# Patient Record
Sex: Female | Born: 1940 | Race: White | Hispanic: No | Marital: Single | Smoking: Current every day smoker
Health system: Southern US, Community
[De-identification: ages and names within clinical notes are randomized; demographics above are authoritative.]

## PROBLEM LIST (undated history)

## (undated) DIAGNOSIS — K219 Gastro-esophageal reflux disease without esophagitis: Secondary | ICD-10-CM

## (undated) DIAGNOSIS — I2510 Atherosclerotic heart disease of native coronary artery without angina pectoris: Secondary | ICD-10-CM

## (undated) DIAGNOSIS — D649 Anemia, unspecified: Secondary | ICD-10-CM

## (undated) DIAGNOSIS — I517 Cardiomegaly: Secondary | ICD-10-CM

## (undated) DIAGNOSIS — I639 Cerebral infarction, unspecified: Secondary | ICD-10-CM

## (undated) DIAGNOSIS — M542 Cervicalgia: Secondary | ICD-10-CM

## (undated) DIAGNOSIS — E119 Type 2 diabetes mellitus without complications: Secondary | ICD-10-CM

## (undated) DIAGNOSIS — R0609 Other forms of dyspnea: Secondary | ICD-10-CM

## (undated) DIAGNOSIS — M1990 Unspecified osteoarthritis, unspecified site: Secondary | ICD-10-CM

## (undated) DIAGNOSIS — J449 Chronic obstructive pulmonary disease, unspecified: Secondary | ICD-10-CM

## (undated) DIAGNOSIS — J4489 Other specified chronic obstructive pulmonary disease: Secondary | ICD-10-CM

## (undated) DIAGNOSIS — I10 Essential (primary) hypertension: Secondary | ICD-10-CM

## (undated) DIAGNOSIS — E785 Hyperlipidemia, unspecified: Secondary | ICD-10-CM

## (undated) DIAGNOSIS — I5189 Other ill-defined heart diseases: Secondary | ICD-10-CM

## (undated) DIAGNOSIS — Q619 Cystic kidney disease, unspecified: Secondary | ICD-10-CM

## (undated) DIAGNOSIS — E039 Hypothyroidism, unspecified: Secondary | ICD-10-CM

## (undated) DIAGNOSIS — K5190 Ulcerative colitis, unspecified, without complications: Secondary | ICD-10-CM

## (undated) DIAGNOSIS — R0600 Dyspnea, unspecified: Secondary | ICD-10-CM

## (undated) DIAGNOSIS — R51 Headache: Secondary | ICD-10-CM

## (undated) DIAGNOSIS — Z9289 Personal history of other medical treatment: Secondary | ICD-10-CM

## (undated) DIAGNOSIS — F419 Anxiety disorder, unspecified: Secondary | ICD-10-CM

## (undated) HISTORY — PX: ABDOMINAL HYSTERECTOMY: SHX81

## (undated) HISTORY — DX: Cerebral infarction, unspecified: I63.9

## (undated) HISTORY — DX: Hypothyroidism, unspecified: E03.9

## (undated) HISTORY — DX: Dyspnea, unspecified: R06.00

## (undated) HISTORY — PX: KNEE SURGERY: SHX244

## (undated) HISTORY — PX: BREAST CYST EXCISION: SHX579

## (undated) HISTORY — DX: Type 2 diabetes mellitus without complications: E11.9

## (undated) HISTORY — DX: Hyperlipidemia, unspecified: E78.5

## (undated) HISTORY — PX: BACK SURGERY: SHX140

## (undated) HISTORY — DX: Cardiomegaly: I51.7

## (undated) HISTORY — DX: Other forms of dyspnea: R06.09

## (undated) HISTORY — DX: Cystic kidney disease, unspecified: Q61.9

## (undated) HISTORY — DX: Other ill-defined heart diseases: I51.89

## (undated) HISTORY — PX: TONSILLECTOMY: SUR1361

## (undated) HISTORY — PX: APPENDECTOMY: SHX54

## (undated) HISTORY — DX: Essential (primary) hypertension: I10

## (undated) HISTORY — DX: Cervicalgia: M54.2

## (undated) HISTORY — DX: Chronic obstructive pulmonary disease, unspecified: J44.9

## (undated) HISTORY — PX: CHOLECYSTECTOMY: SHX55

## (undated) HISTORY — DX: Atherosclerotic heart disease of native coronary artery without angina pectoris: I25.10

## (undated) HISTORY — DX: Other specified chronic obstructive pulmonary disease: J44.89

## (undated) HISTORY — PX: OTHER SURGICAL HISTORY: SHX169

---

## 2004-09-19 ENCOUNTER — Ambulatory Visit: Payer: Self-pay | Admitting: Internal Medicine

## 2004-09-26 ENCOUNTER — Ambulatory Visit: Payer: Self-pay | Admitting: Internal Medicine

## 2005-07-11 ENCOUNTER — Ambulatory Visit: Payer: Self-pay | Admitting: Cardiovascular Disease

## 2005-07-11 HISTORY — PX: CARDIAC CATHETERIZATION: SHX172

## 2005-07-15 ENCOUNTER — Ambulatory Visit: Payer: Self-pay | Admitting: Oncology

## 2005-07-18 ENCOUNTER — Ambulatory Visit (HOSPITAL_COMMUNITY): Admission: RE | Admit: 2005-07-18 | Discharge: 2005-07-18 | Payer: Self-pay | Admitting: Oncology

## 2005-07-25 ENCOUNTER — Encounter: Admission: RE | Admit: 2005-07-25 | Discharge: 2005-07-25 | Payer: Self-pay | Admitting: Oncology

## 2005-08-12 ENCOUNTER — Ambulatory Visit (HOSPITAL_COMMUNITY): Admission: RE | Admit: 2005-08-12 | Discharge: 2005-08-12 | Payer: Self-pay | Admitting: Oncology

## 2005-09-05 ENCOUNTER — Ambulatory Visit: Payer: Self-pay | Admitting: Oncology

## 2005-11-26 ENCOUNTER — Ambulatory Visit (HOSPITAL_COMMUNITY): Admission: RE | Admit: 2005-11-26 | Discharge: 2005-11-26 | Payer: Self-pay | Admitting: Oncology

## 2005-12-02 ENCOUNTER — Ambulatory Visit: Payer: Self-pay | Admitting: Oncology

## 2006-05-22 ENCOUNTER — Ambulatory Visit: Payer: Self-pay | Admitting: Oncology

## 2006-05-27 LAB — BASIC METABOLIC PANEL
BUN: 13 mg/dL (ref 6–23)
CO2: 29 mEq/L (ref 19–32)
Calcium: 9 mg/dL (ref 8.4–10.5)
Chloride: 103 mEq/L (ref 96–112)
Creatinine, Ser: 0.71 mg/dL (ref 0.40–1.20)
Glucose, Bld: 136 mg/dL — ABNORMAL HIGH (ref 70–99)
Potassium: 3.1 mEq/L — ABNORMAL LOW (ref 3.5–5.3)
Sodium: 144 mEq/L (ref 135–145)

## 2006-06-03 LAB — BASIC METABOLIC PANEL
BUN: 15 mg/dL (ref 6–23)
CO2: 31 mEq/L (ref 19–32)
Calcium: 9.2 mg/dL (ref 8.4–10.5)
Chloride: 96 mEq/L (ref 96–112)
Creatinine, Ser: 0.81 mg/dL (ref 0.40–1.20)
Glucose, Bld: 271 mg/dL — ABNORMAL HIGH (ref 70–99)
Potassium: 3.7 mEq/L (ref 3.5–5.3)
Sodium: 139 mEq/L (ref 135–145)

## 2006-06-04 ENCOUNTER — Ambulatory Visit: Payer: Self-pay | Admitting: Cardiovascular Disease

## 2006-07-12 ENCOUNTER — Emergency Department: Payer: Self-pay | Admitting: Internal Medicine

## 2006-11-12 ENCOUNTER — Ambulatory Visit: Payer: Self-pay | Admitting: Cardiovascular Disease

## 2007-05-11 HISTORY — PX: CARDIAC CATHETERIZATION: SHX172

## 2007-08-17 ENCOUNTER — Inpatient Hospital Stay: Payer: Self-pay | Admitting: Cardiovascular Disease

## 2008-05-15 ENCOUNTER — Ambulatory Visit: Payer: Self-pay | Admitting: Internal Medicine

## 2009-01-03 ENCOUNTER — Ambulatory Visit: Payer: Self-pay | Admitting: Internal Medicine

## 2009-01-03 ENCOUNTER — Ambulatory Visit: Payer: Self-pay | Admitting: Ophthalmology

## 2009-01-09 ENCOUNTER — Ambulatory Visit: Payer: Self-pay | Admitting: Ophthalmology

## 2009-01-29 ENCOUNTER — Ambulatory Visit: Payer: Self-pay | Admitting: Pain Medicine

## 2009-02-14 ENCOUNTER — Ambulatory Visit: Payer: Self-pay | Admitting: Ophthalmology

## 2009-02-20 ENCOUNTER — Ambulatory Visit: Payer: Self-pay | Admitting: Ophthalmology

## 2009-03-10 HISTORY — PX: CARDIAC CATHETERIZATION: SHX172

## 2009-03-29 ENCOUNTER — Ambulatory Visit: Payer: Self-pay | Admitting: Cardiovascular Disease

## 2009-04-30 ENCOUNTER — Inpatient Hospital Stay (HOSPITAL_COMMUNITY): Admission: RE | Admit: 2009-04-30 | Discharge: 2009-05-01 | Payer: Self-pay | Admitting: Neurosurgery

## 2009-05-10 HISTORY — PX: CARDIAC CATHETERIZATION: SHX172

## 2010-05-31 ENCOUNTER — Ambulatory Visit: Payer: Self-pay | Admitting: Oncology

## 2010-06-04 LAB — CMP (CANCER CENTER ONLY)
ALT(SGPT): 29 U/L (ref 10–47)
AST: 23 U/L (ref 11–38)
Albumin: 3.9 g/dL (ref 3.3–5.5)
Alkaline Phosphatase: 67 U/L (ref 26–84)
BUN, Bld: 17 mg/dL (ref 7–22)
CO2: 28 mEq/L (ref 18–33)
Calcium: 9.1 mg/dL (ref 8.0–10.3)
Chloride: 101 mEq/L (ref 98–108)
Creat: 1.1 mg/dl (ref 0.6–1.2)
Glucose, Bld: 108 mg/dL (ref 73–118)
Potassium: 5.3 mEq/L — ABNORMAL HIGH (ref 3.3–4.7)
Sodium: 140 mEq/L (ref 128–145)
Total Bilirubin: 0.5 mg/dl (ref 0.20–1.60)
Total Protein: 6.5 g/dL (ref 6.4–8.1)

## 2010-06-04 LAB — CBC WITH DIFFERENTIAL (CANCER CENTER ONLY)
BASO#: 0 10*3/uL (ref 0.0–0.2)
BASO%: 0.4 % (ref 0.0–2.0)
EOS%: 2.6 % (ref 0.0–7.0)
Eosinophils Absolute: 0.2 10*3/uL (ref 0.0–0.5)
HCT: 34 % — ABNORMAL LOW (ref 34.8–46.6)
HGB: 11.8 g/dL (ref 11.6–15.9)
LYMPH#: 1.6 10*3/uL (ref 0.9–3.3)
LYMPH%: 27.7 % (ref 14.0–48.0)
MCH: 31.1 pg (ref 26.0–34.0)
MCHC: 34.8 g/dL (ref 32.0–36.0)
MCV: 89 fL (ref 81–101)
MONO#: 0.4 10*3/uL (ref 0.1–0.9)
MONO%: 6.9 % (ref 0.0–13.0)
NEUT#: 3.6 10*3/uL (ref 1.5–6.5)
NEUT%: 62.4 % (ref 39.6–80.0)
Platelets: 205 10*3/uL (ref 145–400)
RBC: 3.8 10*6/uL (ref 3.70–5.32)
RDW: 14.1 % (ref 10.5–14.6)
WBC: 5.7 10*3/uL (ref 3.9–10.0)

## 2010-06-07 ENCOUNTER — Ambulatory Visit (HOSPITAL_COMMUNITY): Admission: RE | Admit: 2010-06-07 | Discharge: 2010-06-07 | Payer: Self-pay | Admitting: Oncology

## 2010-06-10 ENCOUNTER — Ambulatory Visit (HOSPITAL_COMMUNITY): Admission: RE | Admit: 2010-06-10 | Discharge: 2010-06-10 | Payer: Self-pay | Admitting: Oncology

## 2010-07-11 ENCOUNTER — Ambulatory Visit: Payer: Self-pay | Admitting: Oncology

## 2010-07-25 LAB — CBC WITH DIFFERENTIAL (CANCER CENTER ONLY)
BASO#: 0.1 10*3/uL (ref 0.0–0.2)
BASO%: 0.6 % (ref 0.0–2.0)
EOS%: 1.3 % (ref 0.0–7.0)
Eosinophils Absolute: 0.2 10*3/uL (ref 0.0–0.5)
HCT: 37.3 % (ref 34.8–46.6)
HGB: 12.8 g/dL (ref 11.6–15.9)
LYMPH#: 1.8 10*3/uL (ref 0.9–3.3)
LYMPH%: 15.9 % (ref 14.0–48.0)
MCH: 31.3 pg (ref 26.0–34.0)
MCHC: 34.5 g/dL (ref 32.0–36.0)
MCV: 91 fL (ref 81–101)
MONO#: 0.6 10*3/uL (ref 0.1–0.9)
MONO%: 5 % (ref 0.0–13.0)
NEUT#: 8.8 10*3/uL — ABNORMAL HIGH (ref 1.5–6.5)
NEUT%: 77.2 % (ref 39.6–80.0)
Platelets: 249 10*3/uL (ref 145–400)
RBC: 4.11 10*6/uL (ref 3.70–5.32)
RDW: 13.1 % (ref 10.5–14.6)
WBC: 11.4 10*3/uL — ABNORMAL HIGH (ref 3.9–10.0)

## 2010-07-25 LAB — BASIC METABOLIC PANEL - CANCER CENTER ONLY
BUN, Bld: 27 mg/dL — ABNORMAL HIGH (ref 7–22)
CO2: 27 mEq/L (ref 18–33)
Calcium: 9 mg/dL (ref 8.0–10.3)
Chloride: 97 mEq/L — ABNORMAL LOW (ref 98–108)
Creat: 1.1 mg/dl (ref 0.6–1.2)
Glucose, Bld: 137 mg/dL — ABNORMAL HIGH (ref 73–118)
Potassium: 4.7 mEq/L (ref 3.3–4.7)
Sodium: 135 mEq/L (ref 128–145)

## 2010-12-01 ENCOUNTER — Encounter: Payer: Self-pay | Admitting: Oncology

## 2010-12-21 ENCOUNTER — Emergency Department: Payer: Self-pay | Admitting: Emergency Medicine

## 2011-02-17 LAB — CBC
HCT: 34.6 % — ABNORMAL LOW (ref 36.0–46.0)
Hemoglobin: 11.8 g/dL — ABNORMAL LOW (ref 12.0–15.0)
MCHC: 34 g/dL (ref 30.0–36.0)
MCV: 94.8 fL (ref 78.0–100.0)
Platelets: 202 10*3/uL (ref 150–400)
RBC: 3.65 MIL/uL — ABNORMAL LOW (ref 3.87–5.11)
RDW: 13.3 % (ref 11.5–15.5)
WBC: 7.1 10*3/uL (ref 4.0–10.5)

## 2011-02-17 LAB — DIFFERENTIAL
Basophils Absolute: 0 10*3/uL (ref 0.0–0.1)
Basophils Relative: 0 % (ref 0–1)
Eosinophils Absolute: 0.2 10*3/uL (ref 0.0–0.7)
Eosinophils Relative: 2 % (ref 0–5)
Lymphocytes Relative: 21 % (ref 12–46)
Lymphs Abs: 1.5 10*3/uL (ref 0.7–4.0)
Monocytes Absolute: 0.4 10*3/uL (ref 0.1–1.0)
Monocytes Relative: 6 % (ref 3–12)
Neutro Abs: 5 10*3/uL (ref 1.7–7.7)
Neutrophils Relative %: 70 % (ref 43–77)

## 2011-02-17 LAB — GLUCOSE, CAPILLARY
Glucose-Capillary: 108 mg/dL — ABNORMAL HIGH (ref 70–99)
Glucose-Capillary: 113 mg/dL — ABNORMAL HIGH (ref 70–99)
Glucose-Capillary: 137 mg/dL — ABNORMAL HIGH (ref 70–99)

## 2011-02-17 LAB — ABO/RH: ABO/RH(D): O POS

## 2011-02-17 LAB — TYPE AND SCREEN
ABO/RH(D): O POS
Antibody Screen: NEGATIVE

## 2011-03-25 NOTE — Op Note (Signed)
Rhonda Park, Rhonda Park               ACCOUNT NO.:  [id number]   MEDICAL RECORD NO.:  [id number]          PATIENT TYPE:  INP   LOCATION:  3113                         FACILITY:  [facility]   PHYSICIAN:  Kathaleen Maser. Pool, M.D.    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   PROCEDURE NAME:  Left L3-4 laminotomy and microdiskectomy.   SURGEON:  Kathaleen Maser. Pool, MD   ASSISTANT:  Donalee Citrin, MD   ANESTHESIA:  General endotracheal.   INDICATIONS:  Ms. Adderly is a 70 year old female with history of back and  left lower extremity pain, paresthesias, and weakness consistent with a  left L4 radiculopathy.  Workup demonstrates evidence of an L3-4 disk  herniation with an inferior fragment on the left side with marked  compression on the left side L4 nerve root.  The patient has been  counseled as to her options.  She decided to proceed with a left-sided  L4-5 laminotomy and microdiskectomy in hopes of improving her symptoms.   OPERATIVE NOTE:  The patient was brought to the room and placed on  operative table in supine position.  After adequate level of anesthesia  was achieved, the patient was placed prone on Wilson frame,  appropriately padded, and the patient's lumbar regions were prepped and  draped sterilely.  A #10 blade was used to make a curvilinear skin  overlying the L3-4 interspace.  This was carried down sharply in the  midline.  Subperiosteal dissection was then performed exposing the  lamina and facet joints of L3 and L4.  Deep self-retaining retractor was  placed.  Intraoperative x-rays taken and level was confirmed.  Laminotomy was then performed using high-speed drill and Kerrison  rongeurs to remove the inferior aspect of lamina of  L3, medial aspect of  L3-4 facet joint, and superior rim of the L4 lamina.  Ligament flavum  was then elevated and resected in piecemeal fashion using Kerrison  rongeurs.  Underlying thecal sac and exiting left-sided L4 nerve root  were identified.  Microscope was then brought to the field and used for  microdissection of the left-sided L4 nerve root underlying disk  herniation.  Epidural venous plexus were coagulated and cut.  Thecal sac  and L4 nerve root were gently mobilized and retracted towards midline.  Disk herniation was readily apparent.  This was then incised with 15  blade.  Wide disk space clean-out was achieved using pituitary rongeurs,  upbiting pituitary rongeurs, and Epstein curettes to remove all elements  of disk herniation including the free fragment as well as any loose or  obviously degenerative disk material within the interspace.  At this  point, a very thorough diskectomy had been achieved.  There was no  injury to thecal sac or nerve roots.  Wound was then irrigated with  antibiotic solution.  Gelfoam was placed topically for hemostasis which  was found to be  good.  Microscope and retractor system were removed.  Hemostasis was  achieved with electrocautery.  Wound was then closed in layers with  Vicryl suture.  Steri-Strips and sterile dressings were applied.  There  were no complications.  The patient tolerated the procedure well and she  returns to recovery room postoperatively.           ______________________________  Kathaleen Maser Pool, M.D.     HAP/MEDQ  D:  04/30/2009  T:  05/01/2009  Job:  [id number]

## 2012-02-02 ENCOUNTER — Emergency Department: Payer: Self-pay | Admitting: *Deleted

## 2012-02-02 LAB — COMPREHENSIVE METABOLIC PANEL
Albumin: 4.1 g/dL (ref 3.4–5.0)
Anion Gap: 11 (ref 7–16)
Chloride: 107 mmol/L (ref 98–107)
Creatinine: 1.18 mg/dL (ref 0.60–1.30)
EGFR (Non-African Amer.): 48 — ABNORMAL LOW
SGOT(AST): 24 U/L (ref 15–37)
Sodium: 144 mmol/L (ref 136–145)
Total Protein: 7.6 g/dL (ref 6.4–8.2)

## 2012-02-02 LAB — CBC
HGB: 13.5 g/dL (ref 12.0–16.0)
MCH: 30.2 pg (ref 26.0–34.0)
MCHC: 33.2 g/dL (ref 32.0–36.0)
MCV: 91 fL (ref 80–100)
Platelet: 197 10*3/uL (ref 150–440)
RDW: 13.9 % (ref 11.5–14.5)
WBC: 7.3 10*3/uL (ref 3.6–11.0)

## 2012-02-02 LAB — CK TOTAL AND CKMB (NOT AT ARMC)
CK, Total: 42 U/L (ref 21–215)
CK-MB: 0.9 ng/mL (ref 0.5–3.6)

## 2012-02-02 LAB — TROPONIN I: Troponin-I: <0.02 ng/mL

## 2012-06-25 ENCOUNTER — Emergency Department: Payer: Self-pay | Admitting: Emergency Medicine

## 2012-07-06 ENCOUNTER — Emergency Department: Payer: Self-pay

## 2013-01-08 ENCOUNTER — Emergency Department: Payer: Self-pay | Admitting: Emergency Medicine

## 2013-04-22 ENCOUNTER — Encounter: Payer: Self-pay | Admitting: *Deleted

## 2013-05-03 ENCOUNTER — Ambulatory Visit: Payer: Self-pay | Admitting: Cardiovascular Disease

## 2013-05-06 ENCOUNTER — Ambulatory Visit: Payer: Self-pay | Admitting: Cardiovascular Disease

## 2013-05-09 ENCOUNTER — Ambulatory Visit: Payer: Self-pay | Admitting: Cardiovascular Disease

## 2013-09-22 ENCOUNTER — Emergency Department: Payer: Self-pay | Admitting: Emergency Medicine

## 2013-10-10 DIAGNOSIS — I639 Cerebral infarction, unspecified: Secondary | ICD-10-CM

## 2013-10-10 HISTORY — DX: Cerebral infarction, unspecified: I63.9

## 2013-10-11 LAB — COMPREHENSIVE METABOLIC PANEL
Alkaline Phosphatase: 89 U/L
Anion Gap: 5 — ABNORMAL LOW (ref 7–16)
Bilirubin,Total: 0.2 mg/dL (ref 0.2–1.0)
Co2: 32 mmol/L (ref 21–32)
Creatinine: 1.1 mg/dL (ref 0.60–1.30)
EGFR (African American): 59 — ABNORMAL LOW
EGFR (Non-African Amer.): 51 — ABNORMAL LOW
Glucose: 186 mg/dL — ABNORMAL HIGH (ref 65–99)
Potassium: 4.5 mmol/L (ref 3.5–5.1)
SGPT (ALT): 26 U/L (ref 12–78)
Sodium: 139 mmol/L (ref 136–145)

## 2013-10-11 LAB — TROPONIN I: Troponin-I: <0.02 ng/mL

## 2013-10-11 LAB — CBC
HCT: 37.7 % (ref 35.0–47.0)
MCH: 30 pg (ref 26.0–34.0)
MCV: 89 fL (ref 80–100)
RBC: 4.23 10*6/uL (ref 3.80–5.20)
RDW: 13.7 % (ref 11.5–14.5)
WBC: 7.4 10*3/uL (ref 3.6–11.0)

## 2013-10-11 LAB — URINALYSIS, COMPLETE
Bacteria: NONE SEEN
Bilirubin,UR: NEGATIVE
Blood: NEGATIVE
Glucose,UR: NEGATIVE mg/dL (ref 0–75)
Hyaline Cast: 30
Nitrite: NEGATIVE
Ph: 5 (ref 4.5–8.0)
RBC,UR: 17 /HPF (ref 0–5)
Specific Gravity: 1.026 (ref 1.003–1.030)
WBC UR: 155 /HPF (ref 0–5)

## 2013-10-12 ENCOUNTER — Inpatient Hospital Stay: Payer: Self-pay | Admitting: Family Medicine

## 2013-10-13 LAB — URINE CULTURE

## 2013-10-13 LAB — LIPID PANEL
Cholesterol: 206 mg/dL — ABNORMAL HIGH (ref 0–200)
HDL Cholesterol: 46 mg/dL (ref 40–60)
Ldl Cholesterol, Calc: 124 mg/dL — ABNORMAL HIGH (ref 0–100)
Triglycerides: 180 mg/dL (ref 0–200)

## 2013-10-13 LAB — COMPREHENSIVE METABOLIC PANEL
Alkaline Phosphatase: 77 U/L
Anion Gap: 3 — ABNORMAL LOW (ref 7–16)
BUN: 16 mg/dL (ref 7–18)
Bilirubin,Total: 0.4 mg/dL (ref 0.2–1.0)
Calcium, Total: 8.6 mg/dL (ref 8.5–10.1)
Co2: 31 mmol/L (ref 21–32)
EGFR (African American): >60
EGFR (Non-African Amer.): >60
Glucose: 101 mg/dL — ABNORMAL HIGH (ref 65–99)
SGPT (ALT): 23 U/L (ref 12–78)
Total Protein: 6.4 g/dL (ref 6.4–8.2)

## 2013-10-13 LAB — CBC WITH DIFFERENTIAL/PLATELET
Basophil #: 0 10*3/uL (ref 0.0–0.1)
Basophil %: 0.6 %
Eosinophil %: 1.7 %
Lymphocyte %: 28.7 %
MCH: 29.4 pg (ref 26.0–34.0)
MCHC: 33.3 g/dL (ref 32.0–36.0)
Neutrophil #: 3.3 10*3/uL (ref 1.4–6.5)
Neutrophil %: 62 %
RBC: 4.02 10*6/uL (ref 3.80–5.20)
RDW: 13.9 % (ref 11.5–14.5)
WBC: 5.3 10*3/uL (ref 3.6–11.0)

## 2013-10-13 LAB — HEMOGLOBIN A1C: Hemoglobin A1C: 6.4 % — ABNORMAL HIGH (ref 4.2–6.3)

## 2013-10-13 LAB — TSH: Thyroid Stimulating Horm: 3.41 u[IU]/mL

## 2013-10-24 ENCOUNTER — Ambulatory Visit: Payer: Self-pay | Admitting: Cardiovascular Disease

## 2013-10-27 ENCOUNTER — Ambulatory Visit (INDEPENDENT_AMBULATORY_CARE_PROVIDER_SITE_OTHER): Payer: Medicare Other | Admitting: Cardiovascular Disease

## 2013-10-27 ENCOUNTER — Encounter: Payer: Self-pay | Admitting: Cardiovascular Disease

## 2013-10-27 VITALS — BP 161/88 | HR 79 | Ht 66.0 in | Wt 190.5 lb

## 2013-10-27 DIAGNOSIS — I714 Abdominal aortic aneurysm, without rupture, unspecified: Secondary | ICD-10-CM

## 2013-10-27 DIAGNOSIS — R0789 Other chest pain: Secondary | ICD-10-CM

## 2013-10-27 DIAGNOSIS — I10 Essential (primary) hypertension: Secondary | ICD-10-CM

## 2013-10-27 DIAGNOSIS — E785 Hyperlipidemia, unspecified: Secondary | ICD-10-CM | POA: Insufficient documentation

## 2013-10-27 DIAGNOSIS — I2510 Atherosclerotic heart disease of native coronary artery without angina pectoris: Secondary | ICD-10-CM

## 2013-10-27 NOTE — Assessment & Plan Note (Signed)
Check basic metabolic profile today given that spironolactone was resumed recently. Continue current medications that include spironolactone, metoprolol and Imdur. His blood pressure continues to be elevated, we can consider adding an ACE inhibitor/ARB or amlodipine.

## 2013-10-27 NOTE — Patient Instructions (Signed)
Labs today.   Continue same medications.   Request cardiac records from [facility].   Follow up in 1 month.

## 2013-10-27 NOTE — Assessment & Plan Note (Signed)
She seems to be stable from a cardiac standpoint with no evidence of angina. Continue medical therapy.

## 2013-10-27 NOTE — Progress Notes (Signed)
HPI  This is a 72 year old female who is here today to reestablish cardiovascular care. I saw her in the past [facility]. She has known history of coronary artery disease with previous stenting of the right coronary artery. Most recent cardiac catheterization in 2010 showed moderate ostial left main stenosis with 90% distal LAD stenosis close to the apex and mild instent restenosis in the right coronary artery. She has known history of refractory hypertension with previous evidence of hyperaldosteronism with severe hyperkalemia. Imaging was suggestive of adrenal hyperplasia. She has been treated successfully with spironolactone. She presented recently to [facility] with headache and hypertensive urgency. 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 Echocardiogram showed normal LV systolic function with ? moderate aortic insufficiency. Carotid duplex showed mild less than 50% disease bilaterally. Blood pressure medications were held to allow "permissive hypertension". However, blood pressure started going up significantly after discharge and she resumed Toprol and spironolactone. She is overall feeling better. She denies any chest pain or dyspnea.  Allergies  Allergen Reactions  . Demerol [Meperidine]   . Ivp Dye [Iodinated Diagnostic Agents]   . Shellfish Allergy      Current Outpatient Prescriptions on File Prior to Visit  Medication Sig Dispense Refill  . aspirin 81 MG tablet Take 81 mg by mouth daily.      . clonazePAM (KLONOPIN) 1 MG tablet Take 1 mg by mouth 2 (two) times daily.      Marland Kitchen esomeprazole (NEXIUM) 40 MG capsule Take 40 mg by mouth daily before breakfast.      . ezetimibe (ZETIA) 10 MG tablet Take 10 mg by mouth daily.      . isosorbide mononitrate (IMDUR) 60 MG 24 hr tablet Take 60 mg by mouth daily.      Marland Kitchen levothyroxine (SYNTHROID, LEVOTHROID) 100 MCG tablet Take 100 mcg by mouth daily before breakfast.      . sertraline (ZOLOFT) 100 MG tablet  Take 100 mg by mouth daily.      . sitaGLIPtin (JANUVIA) 100 MG tablet Take 100 mg by mouth daily.      Marland Kitchen spironolactone (ALDACTONE) 25 MG tablet Take 50 mg by mouth daily.        No current facility-administered medications on file prior to visit.     Past Medical History  Diagnosis Date  . Cardiomegaly   . Cervicalgia   . Chronic airway obstruction, not elsewhere classified   . Unspecified congenital cystic kidney disease   . Type II or unspecified type diabetes mellitus without mention of complication, not stated as uncontrolled   . Unspecified hypothyroidism   . Stroke   . Coronary artery disease     Remote RCA stent. Most recent cardiac catheterization in May of 2010 showed an ejection fraction of 65% with 50% ostial left main stenosis and 90% distal LAD stenosis close to the apex  . Coronary atherosclerosis of unspecified type of vessel, native or graft   . Unspecified essential hypertension     With evidence of hyperaldosteronism with severe hypokalemia. 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  Marland Kitchen Hyperlipidemia      Past Surgical History  Procedure Laterality Date  . Tonsillectomy    . Hysterectomy    . Cholecystectomy    . Back surgery    . Appendectomy    . Knee surgery    . Posterior segment      unlisted procedure  . Cardiac catheterization  03/2009    [facility]  . Cardiac  catheterization  05/2007    [facility]  . Cardiac catheterization  05/2009    ''  . Cardiac catheterization  07/2005    ''     Family History  Problem Relation Age of Onset  . Heart attack Mother      History   Social History  . Marital Status: Divorced    Spouse Name: N/A    Number of Children: N/A  . Years of Education: N/A   Occupational History  . Not on file.   Social History Main Topics  . Smoking status: Current Every Day Smoker -- 0.25 packs/day for 20 years    Types: Cigarettes  . Smokeless tobacco: Not on file  . Alcohol Use: No  . Drug Use: No  . Sexual Activity:  Not on file   Other Topics Concern  . Not on file   Social History Narrative  . No narrative on file     ROS A 10 point review of system was performed. It is negative other than that mentioned in the history of present illness.   PHYSICAL EXAM   BP 161/88  Pulse 79  Ht 5\' 6"  (1.676 m)  Wt 190 lb 8 oz (86.41 kg)  BMI 30.76 kg/m2 Constitutional: She is oriented to person, place, and time. She appears well-developed and well-nourished. No distress.  HENT: No nasal discharge.  Head: Normocephalic and atraumatic.  Eyes: Pupils are equal and round. No discharge.  Neck: Normal range of motion. Neck supple. No JVD present. No thyromegaly present.  Cardiovascular: Normal rate, regular rhythm, normal heart sounds. Exam reveals no gallop and no friction rub. No murmur heard.  Pulmonary/Chest: Effort normal and breath sounds normal. No stridor. No respiratory distress. She has no wheezes. She has no rales. She exhibits no tenderness.  Abdominal: Soft. Bowel sounds are normal. She exhibits no distension. There is no tenderness. There is no rebound and no guarding.  Musculoskeletal: Normal range of motion. She exhibits no edema and no tenderness.  Neurological: She is alert and oriented to person, place, and time. Coordination normal.  Skin: Skin is warm and dry. No rash noted. She is not diaphoretic. No erythema. No pallor.  Psychiatric: She has a normal mood and affect. Her behavior is normal. Judgment and thought content normal.     EKG: Normal sinus rhythm with ST changes in the inferior and anterolateral leads.   ASSESSMENT AND PLAN

## 2013-10-27 NOTE — Assessment & Plan Note (Signed)
She is currently on Zetia. There is an indication for treatment with a statin. It is not entirely clear if she has evidence of intolerance. I will request a previous records.

## 2013-10-27 NOTE — Assessment & Plan Note (Signed)
He does mention that she had and abdominal aortic aneurysm on Aorta Scan. It was moderate at 4.5 cm. I will discuss this with the patient during her next visit and consider repeat abdominal aortic ultrasound for surveillance.

## 2013-10-28 LAB — BASIC METABOLIC PANEL
BUN/Creatinine Ratio: 16 (ref 11–26)
CO2: 23 mmol/L (ref 18–29)
Calcium: 9.3 mg/dL (ref 8.6–10.2)
Potassium: 4.5 mmol/L (ref 3.5–5.2)
Sodium: 141 mmol/L (ref 134–144)

## 2013-11-02 ENCOUNTER — Emergency Department: Payer: Self-pay | Admitting: Internal Medicine

## 2013-11-02 LAB — COMPREHENSIVE METABOLIC PANEL
Albumin: 3.7 g/dL (ref 3.4–5.0)
Alkaline Phosphatase: 65 U/L
Anion Gap: 3 — ABNORMAL LOW (ref 7–16)
Chloride: 100 mmol/L (ref 98–107)
Creatinine: 1.22 mg/dL (ref 0.60–1.30)
EGFR (Non-African Amer.): 45 — ABNORMAL LOW
Glucose: 120 mg/dL — ABNORMAL HIGH (ref 65–99)
SGOT(AST): 32 U/L (ref 15–37)
SGPT (ALT): 35 U/L (ref 12–78)
Sodium: 134 mmol/L — ABNORMAL LOW (ref 136–145)
Total Protein: 7.1 g/dL (ref 6.4–8.2)

## 2013-11-02 LAB — URINALYSIS, COMPLETE
Bacteria: NONE SEEN
Bilirubin,UR: NEGATIVE
Glucose,UR: NEGATIVE mg/dL (ref 0–75)
Hyaline Cast: 1
Leukocyte Esterase: NEGATIVE
Ph: 5 (ref 4.5–8.0)
Protein: NEGATIVE
RBC,UR: <1 /HPF (ref 0–5)
Specific Gravity: 1.01 (ref 1.003–1.030)
Squamous Epithelial: NONE SEEN

## 2013-11-02 LAB — CBC
HCT: 37.9 % (ref 35.0–47.0)
MCH: 28.8 pg (ref 26.0–34.0)
MCHC: 32.5 g/dL (ref 32.0–36.0)
RBC: 4.29 10*6/uL (ref 3.80–5.20)

## 2013-11-02 LAB — CK TOTAL AND CKMB (NOT AT ARMC): CK, Total: 77 U/L (ref 21–215)

## 2013-11-07 LAB — CULTURE, BLOOD (SINGLE)

## 2013-11-29 ENCOUNTER — Ambulatory Visit (INDEPENDENT_AMBULATORY_CARE_PROVIDER_SITE_OTHER): Payer: Medicare Other | Admitting: Cardiovascular Disease

## 2013-11-29 ENCOUNTER — Encounter: Payer: Self-pay | Admitting: Cardiovascular Disease

## 2013-11-29 VITALS — BP 120/78 | HR 56 | Ht 67.0 in | Wt 187.2 lb

## 2013-11-29 DIAGNOSIS — I714 Abdominal aortic aneurysm, without rupture, unspecified: Secondary | ICD-10-CM

## 2013-11-29 DIAGNOSIS — I2510 Atherosclerotic heart disease of native coronary artery without angina pectoris: Secondary | ICD-10-CM

## 2013-11-29 DIAGNOSIS — E785 Hyperlipidemia, unspecified: Secondary | ICD-10-CM

## 2013-11-29 DIAGNOSIS — I10 Essential (primary) hypertension: Secondary | ICD-10-CM

## 2013-11-29 DIAGNOSIS — R000 Tachycardia, unspecified: Secondary | ICD-10-CM

## 2013-11-29 NOTE — Assessment & Plan Note (Signed)
Records indicate abdominal aortic aneurysm detected with Aorta Scan. It was moderate in size at 4.5 cm. I not sure how accurate these scans are.  I recommend an abdominal aortic duplex exam for evaluation.

## 2013-11-29 NOTE — Assessment & Plan Note (Signed)
She has known history of intolerance to statins. She is currently on WelChol.

## 2013-11-29 NOTE — Progress Notes (Signed)
HPI  This is a 73 year old female who is here today for a followup visit. I saw her in the past [facility]. She has known history of coronary artery disease with previous stenting of the right coronary artery. Most recent cardiac catheterization in 2010 showed moderate ostial left main stenosis with 90% distal LAD stenosis close to the apex and mild instent restenosis in the right coronary artery. She has known history of refractory hypertension with previous evidence of hyperaldosteronism with severe hypokalemia. Imaging was suggestive of adrenal adenoma. She has been treated successfully with spironolactone. She presented recently to [facility] with headache and hypertensive urgency. 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 Echocardiogram showed normal LV systolic function with ? moderate aortic insufficiency. Carotid duplex showed mild less than 50% disease bilaterally. Blood pressure medications were held to allow "permissive hypertension".  She resumed her previous blood pressure medications with significant improvement. She denies any chest pain or dyspnea.  Allergies  Allergen Reactions  . Demerol [Meperidine]   . Ivp Dye [Iodinated Diagnostic Agents]   . Shellfish Allergy      Current Outpatient Prescriptions on File Prior to Visit  Medication Sig Dispense Refill  . aspirin 81 MG tablet Take 81 mg by mouth daily.      . clonazePAM (KLONOPIN) 1 MG tablet Take 1 mg by mouth 2 (two) times daily.      . clopidogrel (PLAVIX) 75 MG tablet Take 75 mg by mouth daily with breakfast.      . esomeprazole (NEXIUM) 40 MG capsule Take 40 mg by mouth daily before breakfast.      . ezetimibe (ZETIA) 10 MG tablet Take 10 mg by mouth daily.      . isosorbide mononitrate (IMDUR) 60 MG 24 hr tablet Take 60 mg by mouth daily.      Marland Kitchen levothyroxine (SYNTHROID, LEVOTHROID) 100 MCG tablet Take 100 mcg by mouth daily before breakfast.      . metoprolol succinate  (TOPROL-XL) 100 MG 24 hr tablet Take 100 mg by mouth daily. Take with or immediately following a meal.      . sertraline (ZOLOFT) 100 MG tablet Take 100 mg by mouth daily.      . sitaGLIPtin (JANUVIA) 100 MG tablet Take 100 mg by mouth daily.      Marland Kitchen spironolactone (ALDACTONE) 25 MG tablet Take 50 mg by mouth 2 (two) times daily.        No current facility-administered medications on file prior to visit.     Past Medical History  Diagnosis Date  . Cardiomegaly   . Cervicalgia   . Chronic airway obstruction, not elsewhere classified   . Unspecified congenital cystic kidney disease   . Type II or unspecified type diabetes mellitus without mention of complication, not stated as uncontrolled   . Unspecified hypothyroidism   . Coronary artery disease     Remote RCA stent. Most recent cardiac catheterization in May of 2010 showed an ejection fraction of 65% with 50% ostial left main stenosis and 90% distal LAD stenosis close to the apex  . Coronary atherosclerosis of unspecified type of vessel, native or graft   . Unspecified essential hypertension     With evidence of hyperaldosteronism with severe hypokalemia. 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  Marland Kitchen Hyperlipidemia   . Stroke      Past Surgical History  Procedure Laterality Date  . Tonsillectomy    . Hysterectomy    . Cholecystectomy    .  Back surgery    . Appendectomy    . Knee surgery    . Posterior segment      unlisted procedure  . Cardiac [facility]  03/2009    [facility]  . Cardiac catheterization  05/2007    [facility]  . Cardiac catheterization  05/2009    ''  . Cardiac catheterization  07/2005    ''     Family History  Problem Relation Age of Onset  . Heart attack Mother      History   Social History  . Marital Status: Divorced    Spouse Name: N/A    Number of Children: N/A  . Years of Education: N/A   Occupational History  . Not on file.   Social History Main Topics  . Smoking status: Current  Every Day Smoker -- 0.25 packs/day for 20 years    Types: Cigarettes  . Smokeless tobacco: Not on file  . Alcohol Use: No  . Drug Use: No  . Sexual Activity: Not on file   Other Topics Concern  . Not on file   Social History Narrative  . No narrative on file     ROS A 10 point review of system was performed. It is negative other than that mentioned in the history of present illness.   PHYSICAL EXAM   BP 120/78  Pulse 56  Ht 5\' 7"  (1.702 m)  Wt 187 lb 4 oz (84.936 kg)  BMI 29.32 kg/m2 Constitutional: She is oriented to person, place, and time. She appears well-developed and well-nourished. No distress.  HENT: No nasal discharge.  Head: Normocephalic and atraumatic.  Eyes: Pupils are equal and round. No discharge.  Neck: Normal range of motion. Neck supple. No JVD present. No thyromegaly present.  Cardiovascular: Normal rate, regular rhythm, normal heart sounds. Exam reveals no gallop and no friction rub. No murmur heard.  Pulmonary/Chest: Effort normal and breath sounds normal. No stridor. No respiratory distress. She has no wheezes. She has no rales. She exhibits no tenderness.  Abdominal: Soft. Bowel sounds are normal. She exhibits no distension. There is no tenderness. There is no rebound and no guarding.  Musculoskeletal: Normal range of motion. She exhibits no edema and no tenderness.  Neurological: She is alert and oriented to person, place, and time. Coordination normal.  Skin: Skin is warm and dry. No rash noted. She is not diaphoretic. No erythema. No pallor.  Psychiatric: She has a normal mood and affect. Her behavior is normal. Judgment and thought content normal.     EKG: Sinus  Bradycardia  -Nonspecific ST depression  -Nondiagnostic.   ABNORMAL    ASSESSMENT AND PLAN

## 2013-11-29 NOTE — Assessment & Plan Note (Signed)
She has no symptoms of angina. Continue medical therapy. 

## 2013-11-29 NOTE — Assessment & Plan Note (Signed)
Blood pressure is well controlled on current medications. Blood pressure was extremely difficult to control before she was started on spironolactone. Basic metabolic profile from last month showed normal renal function and potassium.

## 2013-11-29 NOTE — Patient Instructions (Signed)
Your physician has requested that you have an abdominal aorta duplex. During this test, an ultrasound is used to evaluate the aorta. Allow 30 minutes for this exam. Do not eat after midnight the day before and avoid carbonated beverages  Continue same medications.   Your physician wants you to follow-up in: 6 months.  You will receive a reminder letter in the mail two months in advance. If you don't receive a letter, please call our office to schedule the follow-up appointment.

## 2013-12-01 ENCOUNTER — Encounter (INDEPENDENT_AMBULATORY_CARE_PROVIDER_SITE_OTHER): Payer: Medicare Other

## 2013-12-01 DIAGNOSIS — I714 Abdominal aortic aneurysm, without rupture, unspecified: Secondary | ICD-10-CM

## 2013-12-02 ENCOUNTER — Other Ambulatory Visit: Payer: Self-pay | Admitting: *Deleted

## 2013-12-02 ENCOUNTER — Encounter: Payer: Self-pay | Admitting: *Deleted

## 2013-12-02 MED ORDER — CLOPIDOGREL BISULFATE 75 MG PO TABS: 75.0000 mg | ORAL_TABLET | Freq: Every day | ORAL | Status: DC

## 2013-12-02 NOTE — Telephone Encounter (Signed)
Please send into [facility] on [address]

## 2014-05-11 ENCOUNTER — Other Ambulatory Visit: Payer: Self-pay | Admitting: Neurosurgery

## 2014-05-16 NOTE — Telephone Encounter (Signed)
This encounter was created in error - please disregard.

## 2014-05-19 ENCOUNTER — Encounter (HOSPITAL_COMMUNITY)
Admission: RE | Admit: 2014-05-19 | Discharge: 2014-05-19 | Disposition: A | Discharge status: Home / Self Care | Payer: Medicare Other | Source: Ambulatory Visit | Attending: Neurosurgery | Admitting: Neurosurgery

## 2014-05-19 ENCOUNTER — Ambulatory Visit (HOSPITAL_COMMUNITY)
Admission: RE | Admit: 2014-05-19 | Discharge: 2014-05-19 | Disposition: A | Discharge status: Home / Self Care | Payer: Medicare Other | Source: Ambulatory Visit | Attending: Anesthesiology | Admitting: Anesthesiology

## 2014-05-19 ENCOUNTER — Encounter (HOSPITAL_COMMUNITY): Payer: Self-pay | Admitting: Pharmacy Technician

## 2014-05-19 ENCOUNTER — Encounter (HOSPITAL_COMMUNITY): Payer: Self-pay

## 2014-05-19 DIAGNOSIS — Z01818 Encounter for other preprocedural examination: Secondary | ICD-10-CM | POA: Insufficient documentation

## 2014-05-19 HISTORY — DX: Headache: R51

## 2014-05-19 HISTORY — DX: Anxiety disorder, unspecified: F41.9

## 2014-05-19 HISTORY — DX: Unspecified osteoarthritis, unspecified site: M19.90

## 2014-05-19 HISTORY — DX: Gastro-esophageal reflux disease without esophagitis: K21.9

## 2014-05-19 HISTORY — DX: Anemia, unspecified: D64.9

## 2014-05-19 LAB — CBC WITH DIFFERENTIAL/PLATELET
BASOS ABS: 0 10*3/uL (ref 0.0–0.1)
BASOS PCT: 0 % (ref 0–1)
BLASTS: 0 %
Band Neutrophils: 0 % (ref 0–10)
Eosinophils Absolute: 0.1 10*3/uL (ref 0.0–0.7)
Eosinophils Relative: 2 % (ref 0–5)
HEMATOCRIT: 36.5 % (ref 36.0–46.0)
Hemoglobin: 11.6 g/dL — ABNORMAL LOW (ref 12.0–15.0)
LYMPHS ABS: 1.1 10*3/uL (ref 0.7–4.0)
LYMPHS PCT: 18 % (ref 12–46)
MCH: 29.9 pg (ref 26.0–34.0)
MCHC: 31.8 g/dL (ref 30.0–36.0)
MCV: 94.1 fL (ref 78.0–100.0)
METAMYELOCYTES PCT: 0 %
Monocytes Absolute: 0.4 10*3/uL (ref 0.1–1.0)
Monocytes Relative: 6 % (ref 3–12)
Myelocytes: 0 %
Neutro Abs: 4.5 10*3/uL (ref 1.7–7.7)
Neutrophils Relative %: 74 % (ref 43–77)
PROMYELOCYTES ABS: 0 %
Platelets: 201 10*3/uL (ref 150–400)
RBC: 3.88 MIL/uL (ref 3.87–5.11)
RDW: 13.8 % (ref 11.5–15.5)
WBC: 6.1 10*3/uL (ref 4.0–10.5)
nRBC: 0 /100 WBC

## 2014-05-19 LAB — SURGICAL PCR SCREEN
MRSA, PCR: NEGATIVE
Staphylococcus aureus: POSITIVE — AB

## 2014-05-19 NOTE — Pre-Procedure Instructions (Signed)
Fredderick SeveranceBrigitte Gudger  05/19/2014   Your procedure is scheduled on:  05-23-2014  Tuesday   Report [facility]mitting at 5:30 AM.   Call this number if you have problems the morning of surgery: [phone]   Remember:   Do not eat food or drink liquids after midnight.   Take these medicines the morning of surgery with A SIP OF WATER: Norvasc,klonopin,Nexium,Imdur,levothyroxine(Synthroid),Sertraline(Zoloft)Toprol XL,   Do not wear jewelry, make-up or nail polish.   Do not wear lotions, powders, or perfumes.  .  Do not shave 48 hours prior to surgery. Men may shave face and neck.   Do not bring valuables to the hospital.  [facility] is not responsible  for any belongings or valuables.               Contacts, dentures or bridgework may not be worn into surgery.   Leave suitcase in the car. After surgery it may be brought to your room.   For patients admitted to the hospital, discharge time is determined by your  treatment team.                    Special Instructions: See attached  Sheet for instructions on CHG Shower/Bath    Please read over the following fact sheets that you were given: Pain Booklet, Coughing and Deep Breathing and Surgical Site Infection Prevention

## 2014-05-19 NOTE — Progress Notes (Signed)
Mupirocin Ointment Rx called into [facility] in [place], [place] for positive PCR of staph. Pt's daughter, Brayton ElBritney was notified and she voiced understanding

## 2014-05-22 MED ORDER — DEXAMETHASONE SODIUM PHOSPHATE 10 MG/ML IJ SOLN
10.0000 mg | INTRAMUSCULAR | Status: AC
  Administered 2014-05-23: 10 mg via INTRAVENOUS
  Filled 2014-05-22: qty 1

## 2014-05-22 MED ORDER — CEFAZOLIN SODIUM-DEXTROSE 2-3 GM-% IV SOLR
2.0000 g | INTRAVENOUS | Status: AC
  Administered 2014-05-23: 2 g via INTRAVENOUS
  Filled 2014-05-22: qty 50

## 2014-05-22 NOTE — Progress Notes (Signed)
Anesthesia Chart Review:  Patient is a 73 year old female scheduled for one level ACDF on 05/23/14 by Dr. Jordan LikesPool.  History includes smoking, DM2, COPD, congenital cystic kidney disease, HTN, CAD s/p RCA stent, GERD, anemia, headaches, CVA, HLD, hypothyroidism. The PAT RN did not leave her chart for me to review, but I did receive a clearance note from her cardiologist Dr. Kirke CorinArida.  Her PAT RN did not document when patient was instructed to hold Plavix.   Echo on 10/12/13 showed: LVEF by visual estimate 50-55%, elevated LA pressure, impaired relaxation pattern of LV diastolic filling, moderate AR, severely increased LV posterior wall thickness.   Cardiac cath on 03/29/09 (scanned under Media tab) showed: 50% left main, 20% LAD, 90% distal LAD, 80% D1, 40% RAMUS INT, 30% RCA. LVEF 65%.  Impression: LM stenosis is slightly worse since last cath but still close to 50%, mild in stent restenosis in RCA, the distal LAD lesion is close to the apex.    EKG on 10/27/13 showed: NSR, ST/T wave abnormality, consider lateral ischemia, prolonged QT. EKG on 11/29/13 showed SB. Dr. Kirke CorinArida felt ST depression was non-specific.  Abdominal duplex on 12/02/13 showed: normal caliber abdominal aorta, common and external iliac arteries.   Carotid duplex on 10/12/13 showed: < 50% bilateral ICA stenosis.  Preoperative CXR and labs noted. Her BMET specimen hemolyzed so it will be repeated on the day of surgery.   Note of cardiac clearance is on her chart. Further evaluation and review of same day labs by her assigned anesthesiologist on the day of surgery.  Rhonda Ochsllison Teng Decou, PA-C [facility]/Anesthesiology Phone [phone] 05/22/2014 6:23 PM

## 2014-05-23 ENCOUNTER — Encounter (HOSPITAL_COMMUNITY): Payer: Medicare Other | Admitting: Vascular Surgery

## 2014-05-23 ENCOUNTER — Encounter (HOSPITAL_COMMUNITY)
Admission: RE | Disposition: A | Discharge status: Home / Self Care | Payer: Self-pay | Source: Ambulatory Visit | Attending: Neurosurgery

## 2014-05-23 ENCOUNTER — Inpatient Hospital Stay (HOSPITAL_COMMUNITY): Payer: Medicare Other

## 2014-05-23 ENCOUNTER — Inpatient Hospital Stay (HOSPITAL_COMMUNITY)
Admission: RE | Admit: 2014-05-23 | Discharge: 2014-05-24 | DRG: 472 | Disposition: A | Discharge status: Home / Self Care | Payer: Medicare Other | Source: Ambulatory Visit | Attending: Neurosurgery | Admitting: Neurosurgery

## 2014-05-23 ENCOUNTER — Encounter (HOSPITAL_COMMUNITY): Payer: Self-pay | Admitting: *Deleted

## 2014-05-23 ENCOUNTER — Inpatient Hospital Stay (HOSPITAL_COMMUNITY): Payer: Medicare Other | Admitting: Certified Registered"

## 2014-05-23 DIAGNOSIS — E119 Type 2 diabetes mellitus without complications: Secondary | ICD-10-CM | POA: Diagnosis present

## 2014-05-23 DIAGNOSIS — Z8673 Personal history of transient ischemic attack (TIA), and cerebral infarction without residual deficits: Secondary | ICD-10-CM

## 2014-05-23 DIAGNOSIS — Z9861 Coronary angioplasty status: Secondary | ICD-10-CM

## 2014-05-23 DIAGNOSIS — M4802 Spinal stenosis, cervical region: Secondary | ICD-10-CM | POA: Diagnosis present

## 2014-05-23 DIAGNOSIS — Z7901 Long term (current) use of anticoagulants: Secondary | ICD-10-CM

## 2014-05-23 DIAGNOSIS — E039 Hypothyroidism, unspecified: Secondary | ICD-10-CM | POA: Diagnosis present

## 2014-05-23 DIAGNOSIS — K219 Gastro-esophageal reflux disease without esophagitis: Secondary | ICD-10-CM | POA: Diagnosis present

## 2014-05-23 DIAGNOSIS — E785 Hyperlipidemia, unspecified: Secondary | ICD-10-CM | POA: Diagnosis present

## 2014-05-23 DIAGNOSIS — G992 Myelopathy in diseases classified elsewhere: Secondary | ICD-10-CM | POA: Diagnosis present

## 2014-05-23 DIAGNOSIS — J4489 Other specified chronic obstructive pulmonary disease: Secondary | ICD-10-CM | POA: Diagnosis present

## 2014-05-23 DIAGNOSIS — I10 Essential (primary) hypertension: Secondary | ICD-10-CM | POA: Diagnosis present

## 2014-05-23 DIAGNOSIS — M4712 Other spondylosis with myelopathy, cervical region: Principal | ICD-10-CM | POA: Diagnosis present

## 2014-05-23 DIAGNOSIS — F411 Generalized anxiety disorder: Secondary | ICD-10-CM | POA: Diagnosis present

## 2014-05-23 DIAGNOSIS — F17200 Nicotine dependence, unspecified, uncomplicated: Secondary | ICD-10-CM | POA: Diagnosis present

## 2014-05-23 DIAGNOSIS — I2510 Atherosclerotic heart disease of native coronary artery without angina pectoris: Secondary | ICD-10-CM | POA: Diagnosis present

## 2014-05-23 DIAGNOSIS — J449 Chronic obstructive pulmonary disease, unspecified: Secondary | ICD-10-CM | POA: Diagnosis present

## 2014-05-23 DIAGNOSIS — M129 Arthropathy, unspecified: Secondary | ICD-10-CM | POA: Diagnosis present

## 2014-05-23 DIAGNOSIS — Q619 Cystic kidney disease, unspecified: Secondary | ICD-10-CM

## 2014-05-23 HISTORY — PX: ANTERIOR CERVICAL DECOMP/DISCECTOMY FUSION: SHX1161

## 2014-05-23 LAB — BASIC METABOLIC PANEL
ANION GAP: 12 (ref 5–15)
BUN: 17 mg/dL (ref 6–23)
CALCIUM: 9.2 mg/dL (ref 8.4–10.5)
CO2: 25 mEq/L (ref 19–32)
Chloride: 105 mEq/L (ref 96–112)
Creatinine, Ser: 1.12 mg/dL — ABNORMAL HIGH (ref 0.50–1.10)
GFR calc Af Amer: 56 mL/min — ABNORMAL LOW (ref >90)
GFR calc non Af Amer: 49 mL/min — ABNORMAL LOW (ref >90)
GLUCOSE: 106 mg/dL — AB (ref 70–99)
Potassium: 5.3 mEq/L (ref 3.7–5.3)
SODIUM: 142 meq/L (ref 137–147)

## 2014-05-23 LAB — GLUCOSE, CAPILLARY
GLUCOSE-CAPILLARY: 201 mg/dL — AB (ref 70–99)
GLUCOSE-CAPILLARY: 149 mg/dL — AB (ref 70–99)
Glucose-Capillary: 105 mg/dL — ABNORMAL HIGH (ref 70–99)
Glucose-Capillary: 160 mg/dL — ABNORMAL HIGH (ref 70–99)
Glucose-Capillary: 122 mg/dL — ABNORMAL HIGH (ref 70–99)

## 2014-05-23 SURGERY — ANTERIOR CERVICAL DECOMPRESSION/DISCECTOMY FUSION 1 LEVEL
Anesthesia: General

## 2014-05-23 MED ORDER — FENTANYL CITRATE 0.05 MG/ML IJ SOLN: 25.0000 ug | INTRAMUSCULAR | Status: DC | PRN

## 2014-05-23 MED ORDER — ROCURONIUM BROMIDE 100 MG/10ML IV SOLN
INTRAVENOUS | Status: DC | PRN
  Administered 2014-05-23: 40 mg via INTRAVENOUS

## 2014-05-23 MED ORDER — MUPIROCIN 2 % EX OINT
1.0000 "application " | TOPICAL_OINTMENT | Freq: Two times a day (BID) | CUTANEOUS | Status: AC
  Administered 2014-05-23 – 2014-05-24 (×2): 1 via NASAL

## 2014-05-23 MED ORDER — OXYCODONE-ACETAMINOPHEN 5-325 MG PO TABS: 1.0000 | ORAL_TABLET | ORAL | Status: DC | PRN

## 2014-05-23 MED ORDER — GLYCOPYRROLATE 0.2 MG/ML IJ SOLN
INTRAMUSCULAR | Status: AC
  Filled 2014-05-23: qty 2

## 2014-05-23 MED ORDER — SERTRALINE HCL 100 MG PO TABS
100.0000 mg | ORAL_TABLET | Freq: Every day | ORAL | Status: DC
  Administered 2014-05-23 – 2014-05-24 (×2): 100 mg via ORAL
  Filled 2014-05-23 (×2): qty 1

## 2014-05-23 MED ORDER — PROPOFOL 10 MG/ML IV BOLUS
INTRAVENOUS | Status: AC
  Filled 2014-05-23: qty 20

## 2014-05-23 MED ORDER — LIDOCAINE HCL (CARDIAC) 20 MG/ML IV SOLN
INTRAVENOUS | Status: DC | PRN
  Administered 2014-05-23: 40 mg via INTRAVENOUS

## 2014-05-23 MED ORDER — MUPIROCIN 2 % EX OINT: 1.0000 "application " | TOPICAL_OINTMENT | Freq: Two times a day (BID) | CUTANEOUS | Status: DC

## 2014-05-23 MED ORDER — THROMBIN 5000 UNITS EX SOLR
CUTANEOUS | Status: DC | PRN
  Administered 2014-05-23 (×2): 5000 [IU] via TOPICAL

## 2014-05-23 MED ORDER — HEMOSTATIC AGENTS (NO CHARGE) OPTIME
TOPICAL | Status: DC | PRN
  Administered 2014-05-23: 1 via TOPICAL

## 2014-05-23 MED ORDER — PROPOFOL 10 MG/ML IV BOLUS
INTRAVENOUS | Status: DC | PRN
  Administered 2014-05-23: 75 mg via INTRAVENOUS

## 2014-05-23 MED ORDER — SENNA 8.6 MG PO TABS
1.0000 | ORAL_TABLET | Freq: Two times a day (BID) | ORAL | Status: DC
  Administered 2014-05-23 – 2014-05-24 (×3): 8.6 mg via ORAL
  Filled 2014-05-23 (×3): qty 1

## 2014-05-23 MED ORDER — SODIUM CHLORIDE 0.9 % IV SOLN: 250.0000 mL | INTRAVENOUS | Status: DC

## 2014-05-23 MED ORDER — PROMETHAZINE HCL 25 MG/ML IJ SOLN: 6.2500 mg | INTRAMUSCULAR | Status: DC | PRN

## 2014-05-23 MED ORDER — 0.9 % SODIUM CHLORIDE (POUR BTL) OPTIME
TOPICAL | Status: DC | PRN
  Administered 2014-05-23: 1000 mL

## 2014-05-23 MED ORDER — CLONAZEPAM 0.5 MG PO TABS
1.0000 mg | ORAL_TABLET | Freq: Two times a day (BID) | ORAL | Status: DC
  Administered 2014-05-23 – 2014-05-24 (×2): 1 mg via ORAL
  Filled 2014-05-23 (×2): qty 2

## 2014-05-23 MED ORDER — LINAGLIPTIN 5 MG PO TABS
5.0000 mg | ORAL_TABLET | Freq: Every day | ORAL | Status: DC
  Administered 2014-05-23 – 2014-05-24 (×2): 5 mg via ORAL
  Filled 2014-05-23 (×2): qty 1

## 2014-05-23 MED ORDER — LIDOCAINE HCL (CARDIAC) 20 MG/ML IV SOLN
INTRAVENOUS | Status: AC
  Filled 2014-05-23: qty 5

## 2014-05-23 MED ORDER — SPIRONOLACTONE 25 MG PO TABS
25.0000 mg | ORAL_TABLET | Freq: Two times a day (BID) | ORAL | Status: DC
  Administered 2014-05-23 – 2014-05-24 (×2): 25 mg via ORAL
  Filled 2014-05-23 (×4): qty 1

## 2014-05-23 MED ORDER — SODIUM CHLORIDE 0.9 % IJ SOLN
3.0000 mL | Freq: Two times a day (BID) | INTRAMUSCULAR | Status: DC
  Administered 2014-05-23: 3 mL via INTRAVENOUS

## 2014-05-23 MED ORDER — SODIUM CHLORIDE 0.9 % IJ SOLN
INTRAMUSCULAR | Status: AC
  Filled 2014-05-23: qty 10

## 2014-05-23 MED ORDER — NEOSTIGMINE METHYLSULFATE 10 MG/10ML IV SOLN
INTRAVENOUS | Status: DC | PRN
  Administered 2014-05-23: 3 mg via INTRAVENOUS

## 2014-05-23 MED ORDER — ONDANSETRON HCL 4 MG/2ML IJ SOLN
INTRAMUSCULAR | Status: DC | PRN
  Administered 2014-05-23: 4 mg via INTRAVENOUS

## 2014-05-23 MED ORDER — SUFENTANIL CITRATE 50 MCG/ML IV SOLN
INTRAVENOUS | Status: AC
  Filled 2014-05-23: qty 1

## 2014-05-23 MED ORDER — ACETAMINOPHEN 650 MG RE SUPP: 650.0000 mg | RECTAL | Status: DC | PRN

## 2014-05-23 MED ORDER — SODIUM CHLORIDE 0.9 % IJ SOLN: 3.0000 mL | INTRAMUSCULAR | Status: DC | PRN

## 2014-05-23 MED ORDER — ONDANSETRON HCL 4 MG/2ML IJ SOLN: 4.0000 mg | INTRAMUSCULAR | Status: DC | PRN

## 2014-05-23 MED ORDER — LACTATED RINGERS IV SOLN
INTRAVENOUS | Status: DC | PRN
  Administered 2014-05-23: 07:00:00 via INTRAVENOUS

## 2014-05-23 MED ORDER — PANTOPRAZOLE SODIUM 40 MG PO TBEC
80.0000 mg | DELAYED_RELEASE_TABLET | Freq: Every day | ORAL | Status: DC
  Administered 2014-05-24: 80 mg via ORAL
  Filled 2014-05-23: qty 2

## 2014-05-23 MED ORDER — ALUM & MAG HYDROXIDE-SIMETH 200-200-20 MG/5ML PO SUSP: 30.0000 mL | Freq: Four times a day (QID) | ORAL | Status: DC | PRN

## 2014-05-23 MED ORDER — EZETIMIBE 10 MG PO TABS
10.0000 mg | ORAL_TABLET | Freq: Every day | ORAL | Status: DC
  Administered 2014-05-23 – 2014-05-24 (×2): 10 mg via ORAL
  Filled 2014-05-23 (×2): qty 1

## 2014-05-23 MED ORDER — ONDANSETRON HCL 4 MG/2ML IJ SOLN
INTRAMUSCULAR | Status: AC
  Filled 2014-05-23: qty 2

## 2014-05-23 MED ORDER — MENTHOL 3 MG MT LOZG: 1.0000 | LOZENGE | OROMUCOSAL | Status: DC | PRN

## 2014-05-23 MED ORDER — ACETAMINOPHEN 325 MG PO TABS: 650.0000 mg | ORAL_TABLET | ORAL | Status: DC | PRN

## 2014-05-23 MED ORDER — LEVOTHYROXINE SODIUM 100 MCG PO TABS
100.0000 ug | ORAL_TABLET | Freq: Every day | ORAL | Status: DC
  Administered 2014-05-24: 100 ug via ORAL
  Filled 2014-05-23 (×2): qty 1

## 2014-05-23 MED ORDER — MIDAZOLAM HCL 5 MG/5ML IJ SOLN
INTRAMUSCULAR | Status: DC | PRN
  Administered 2014-05-23: 1 mg via INTRAVENOUS

## 2014-05-23 MED ORDER — PHENOL 1.4 % MT LIQD: 1.0000 | OROMUCOSAL | Status: DC | PRN

## 2014-05-23 MED ORDER — SUFENTANIL CITRATE 50 MCG/ML IV SOLN
INTRAVENOUS | Status: DC | PRN
  Administered 2014-05-23: 20 ug via INTRAVENOUS

## 2014-05-23 MED ORDER — HYDROCODONE-ACETAMINOPHEN 5-325 MG PO TABS
1.0000 | ORAL_TABLET | ORAL | Status: DC | PRN
  Administered 2014-05-23: 1 via ORAL
  Administered 2014-05-23 (×2): 2 via ORAL
  Administered 2014-05-24: 1 via ORAL
  Filled 2014-05-23: qty 1
  Filled 2014-05-23 (×3): qty 2

## 2014-05-23 MED ORDER — NEOSTIGMINE METHYLSULFATE 10 MG/10ML IV SOLN
INTRAVENOUS | Status: AC
  Filled 2014-05-23: qty 1

## 2014-05-23 MED ORDER — AMLODIPINE BESYLATE 10 MG PO TABS
10.0000 mg | ORAL_TABLET | Freq: Every day | ORAL | Status: DC
  Administered 2014-05-24: 10 mg via ORAL
  Filled 2014-05-23: qty 1

## 2014-05-23 MED ORDER — METOPROLOL SUCCINATE ER 100 MG PO TB24
100.0000 mg | ORAL_TABLET | Freq: Every day | ORAL | Status: DC
  Administered 2014-05-24: 100 mg via ORAL
  Filled 2014-05-23: qty 1

## 2014-05-23 MED ORDER — INSULIN ASPART 100 UNIT/ML ~~LOC~~ SOLN
0.0000 [IU] | Freq: Three times a day (TID) | SUBCUTANEOUS | Status: DC
  Administered 2014-05-23: 7 [IU] via SUBCUTANEOUS

## 2014-05-23 MED ORDER — HYDROMORPHONE HCL PF 1 MG/ML IJ SOLN: 0.5000 mg | INTRAMUSCULAR | Status: DC | PRN

## 2014-05-23 MED ORDER — GLYCOPYRROLATE 0.2 MG/ML IJ SOLN
INTRAMUSCULAR | Status: DC | PRN
  Administered 2014-05-23: 0.4 mg via INTRAVENOUS

## 2014-05-23 MED ORDER — CYCLOBENZAPRINE HCL 10 MG PO TABS
10.0000 mg | ORAL_TABLET | Freq: Three times a day (TID) | ORAL | Status: DC | PRN
  Administered 2014-05-23 – 2014-05-24 (×2): 10 mg via ORAL
  Filled 2014-05-23 (×2): qty 1

## 2014-05-23 MED ORDER — ISOSORBIDE MONONITRATE ER 60 MG PO TB24
60.0000 mg | ORAL_TABLET | Freq: Every day | ORAL | Status: DC
  Administered 2014-05-24: 60 mg via ORAL
  Filled 2014-05-23: qty 1

## 2014-05-23 MED ORDER — MIDAZOLAM HCL 2 MG/2ML IJ SOLN
INTRAMUSCULAR | Status: AC
  Filled 2014-05-23: qty 2

## 2014-05-23 MED ORDER — SODIUM CHLORIDE 0.9 % IR SOLN
Status: DC | PRN
  Administered 2014-05-23: 09:00:00

## 2014-05-23 MED ORDER — SUCCINYLCHOLINE CHLORIDE 20 MG/ML IJ SOLN
INTRAMUSCULAR | Status: AC
  Filled 2014-05-23: qty 1

## 2014-05-23 MED ORDER — ROCURONIUM BROMIDE 50 MG/5ML IV SOLN
INTRAVENOUS | Status: AC
  Filled 2014-05-23: qty 1

## 2014-05-23 MED ORDER — CEFAZOLIN SODIUM 1-5 GM-% IV SOLN
1.0000 g | Freq: Three times a day (TID) | INTRAVENOUS | Status: AC
  Administered 2014-05-23 (×2): 1 g via INTRAVENOUS
  Filled 2014-05-23 (×2): qty 50

## 2014-05-23 SURGICAL SUPPLY — 61 items
BAG DECANTER FOR FLEXI CONT (MISCELLANEOUS) ×3 IMPLANT
BENZOIN TINCTURE PRP APPL 2/3 (GAUZE/BANDAGES/DRESSINGS) ×3 IMPLANT
BLADE 10 SAFETY STRL DISP (BLADE) ×3 IMPLANT
BRUSH SCRUB EZ PLAIN DRY (MISCELLANEOUS) ×3 IMPLANT
BUR MATCHSTICK NEURO 3.0 LAGG (BURR) ×3 IMPLANT
CAGE PEEK 7X14X11 (Cage) ×2 IMPLANT
CANISTER SUCT 3000ML (MISCELLANEOUS) ×3 IMPLANT
CLOSURE WOUND 1/2 X4 (GAUZE/BANDAGES/DRESSINGS) ×1
CONT SPEC 4OZ CLIKSEAL STRL BL (MISCELLANEOUS) ×3 IMPLANT
DRAPE C-ARM 42X72 X-RAY (DRAPES) ×6 IMPLANT
DRAPE LAPAROTOMY 100X72 PEDS (DRAPES) ×3 IMPLANT
DRAPE MICROSCOPE ZEISS OPMI (DRAPES) ×3 IMPLANT
DRAPE POUCH INSTRU U-SHP 10X18 (DRAPES) ×3 IMPLANT
DRILL BIT (BIT) ×3 IMPLANT
DURAPREP 6ML APPLICATOR 50/CS (WOUND CARE) ×3 IMPLANT
ELECT COATED BLADE 2.86 ST (ELECTRODE) ×3 IMPLANT
ELECT REM PT RETURN 9FT ADLT (ELECTROSURGICAL) ×3
ELECTRODE REM PT RTRN 9FT ADLT (ELECTROSURGICAL) ×1 IMPLANT
GAUZE SPONGE 4X4 16PLY XRAY LF (GAUZE/BANDAGES/DRESSINGS) IMPLANT
GLOVE BIOGEL PI IND STRL 7.0 (GLOVE) ×1 IMPLANT
GLOVE BIOGEL PI INDICATOR 7.0 (GLOVE) ×2
GLOVE ECLIPSE 7.5 STRL STRAW (GLOVE) ×3 IMPLANT
GLOVE ECLIPSE 8.0 STRL XLNG CF (GLOVE) ×3 IMPLANT
GLOVE ECLIPSE 9.0 STRL (GLOVE) ×3 IMPLANT
GLOVE EXAM NITRILE LRG STRL (GLOVE) IMPLANT
GLOVE EXAM NITRILE MD LF STRL (GLOVE) IMPLANT
GLOVE EXAM NITRILE XL STR (GLOVE) IMPLANT
GLOVE EXAM NITRILE XS STR PU (GLOVE) IMPLANT
GLOVE SS BIOGEL STRL SZ 6.5 (GLOVE) ×2 IMPLANT
GLOVE SUPERSENSE BIOGEL SZ 6.5 (GLOVE) ×4
GOWN STRL REUS W/ TWL LRG LVL3 (GOWN DISPOSABLE) ×1 IMPLANT
GOWN STRL REUS W/ TWL XL LVL3 (GOWN DISPOSABLE) ×2 IMPLANT
GOWN STRL REUS W/TWL 2XL LVL3 (GOWN DISPOSABLE) IMPLANT
GOWN STRL REUS W/TWL LRG LVL3 (GOWN DISPOSABLE) ×2
GOWN STRL REUS W/TWL XL LVL3 (GOWN DISPOSABLE) ×4
HALTER HD/CHIN CERV TRACTION D (MISCELLANEOUS) ×3 IMPLANT
KIT BASIN OR (CUSTOM PROCEDURE TRAY) ×3 IMPLANT
KIT ROOM TURNOVER OR (KITS) ×3 IMPLANT
NEEDLE SPNL 20GX3.5 QUINCKE YW (NEEDLE) ×3 IMPLANT
NS IRRIG 1000ML POUR BTL (IV SOLUTION) ×3 IMPLANT
PACK LAMINECTOMY NEURO (CUSTOM PROCEDURE TRAY) ×3 IMPLANT
PAD ARMBOARD 7.5X6 YLW CONV (MISCELLANEOUS) ×9 IMPLANT
PLATE ELITE VISION 25MM (Plate) ×3 IMPLANT
RUBBERBAND STERILE (MISCELLANEOUS) ×6 IMPLANT
SCREW ST 13X4XST VA NS SPNE (Screw) ×4 IMPLANT
SCREW ST VAR 4 ATL (Screw) ×8 IMPLANT
SPACER SPNL 11X14X7XPEEK CVD (Cage) ×1 IMPLANT
SPCR SPNL 11X14X7XPEEK CVD (Cage) ×1 IMPLANT
SPONGE GAUZE 4X4 12PLY (GAUZE/BANDAGES/DRESSINGS) ×3 IMPLANT
SPONGE INTESTINAL PEANUT (DISPOSABLE) ×3 IMPLANT
SPONGE SURGIFOAM ABS GEL SZ50 (HEMOSTASIS) ×3 IMPLANT
STRIP CLOSURE SKIN 1/2X4 (GAUZE/BANDAGES/DRESSINGS) ×2 IMPLANT
SUT PDS AB 5-0 P3 18 (SUTURE) ×3 IMPLANT
SUT VIC AB 3-0 SH 8-18 (SUTURE) ×3 IMPLANT
SYR 20ML ECCENTRIC (SYRINGE) ×3 IMPLANT
TAPE CLOTH 4X10 WHT NS (GAUZE/BANDAGES/DRESSINGS) ×3 IMPLANT
TAPE CLOTH SURG 4X10 WHT LF (GAUZE/BANDAGES/DRESSINGS) ×3 IMPLANT
TOWEL OR 17X24 6PK STRL BLUE (TOWEL DISPOSABLE) ×3 IMPLANT
TOWEL OR 17X26 10 PK STRL BLUE (TOWEL DISPOSABLE) ×3 IMPLANT
TRAP SPECIMEN MUCOUS 40CC (MISCELLANEOUS) ×3 IMPLANT
WATER STERILE IRR 1000ML POUR (IV SOLUTION) ×3 IMPLANT

## 2014-05-23 NOTE — Anesthesia Postprocedure Evaluation (Signed)
  Anesthesia Post-op Note  Patient: Rhonda SeveranceBrigitte Tangredi  Procedure(s) Performed: Procedure(s): CERVICAL FIVE SIX ANTERIOR CERVICAL DECOMPRESSION/DISCECTOMY FUSION 1 LEVEL (N/A)  Patient Location: PACU  Anesthesia Type:General  Level of Consciousness: awake and alert   Airway and Oxygen Therapy: Patient Spontanous Breathing  Post-op Pain: mild  Post-op Assessment: Post-op Vital signs reviewed and Patient's Cardiovascular Status Stable  Post-op Vital Signs: stable  Last Vitals:  Filed Vitals:   05/23/14 0930  BP:   Pulse: 61  Temp:   Resp: 14    Complications: No apparent anesthesia complications

## 2014-05-23 NOTE — Brief Op Note (Signed)
05/23/2014  9:03 AM  PATIENT:  Rhonda Park  73 y.o. female  PRE-OPERATIVE DIAGNOSIS:  spondylosis/myelopathy  POST-OPERATIVE DIAGNOSIS:  spondylosis/myelopathy  PROCEDURE:  Procedure(s): CERVICAL FIVE SIX ANTERIOR CERVICAL DECOMPRESSION/DISCECTOMY FUSION 1 LEVEL (N/A)  SURGEON:  Surgeon(s) and Role:    * Temple PaciniHenry A Rhiley Solem, MD - Primary  PHYSICIAN ASSISTANT:   ASSISTANTS:    ANESTHESIA:   general  EBL:     BLOOD ADMINISTERED:none  DRAINS: none   LOCAL MEDICATIONS USED:  NONE  SPECIMEN:  No Specimen  DISPOSITION OF SPECIMEN:  N/A  COUNTS:  YES  TOURNIQUET:  * No tourniquets in log *  DICTATION: .Dragon Dictation  PLAN OF CARE: Admit to inpatient   PATIENT DISPOSITION:  PACU - hemodynamically stable.   Delay start of Pharmacological VTE agent (>24hrs) due to surgical blood loss or risk of bleeding: yes

## 2014-05-23 NOTE — Anesthesia Preprocedure Evaluation (Addendum)
Anesthesia Evaluation  Patient identified by MRN, date of birth, ID band Patient awake    Reviewed: Allergy & Precautions, H&P , NPO status , Patient's Chart, lab work & pertinent test results, reviewed documented beta blocker date and time   History of Anesthesia Complications Negative for: history of anesthetic complications  Airway Mallampati: III TM Distance: >3 FB Neck ROM: Full    Dental  (+) Partial Upper, Teeth Intact, Dental Advisory Given   Pulmonary Current Smoker,  breath sounds clear to auscultation        Cardiovascular hypertension, Pt. on medications and Pt. on home beta blockers + CAD Rhythm:Regular Rate:Normal  Denies angina   Neuro/Psych Memory retrieval slightly diminished, CVA in Dec 14 CVA    GI/Hepatic GERD-  ,  Endo/Other  diabetes, Well Controlled, Type 2, Oral Hypoglycemic Agents  Renal/GU Renal InsufficiencyRenal disease     Musculoskeletal   Abdominal (+) + obese,   Peds  Hematology   Anesthesia Other Findings   Reproductive/Obstetrics                          Anesthesia Physical Anesthesia Plan  ASA: III  Anesthesia Plan: General   Post-op Pain Management:    Induction: Intravenous  Airway Management Planned: Oral ETT  Additional Equipment: None  Intra-op Plan:   Post-operative Plan: Extubation in OR  Informed Consent: I have reviewed the patients History and Physical, chart, labs and discussed the procedure including the risks, benefits and alternatives for the proposed anesthesia with the patient or authorized representative who has indicated his/her understanding and acceptance.   Dental advisory given  Plan Discussed with: CRNA, Anesthesiologist and Surgeon  Anesthesia Plan Comments: (i would estimate mildy increased risk for surgery and general anesth due to multiple comorbidities)       Anesthesia Quick Evaluation

## 2014-05-23 NOTE — Anesthesia Procedure Notes (Signed)
Procedure Name: Intubation Date/Time: 05/23/2014 7:53 AM Performed by: Charm BargesBUTLER, Luan Urbani R Pre-anesthesia Checklist: Patient identified, Emergency Drugs available, Suction available, Patient being monitored and Timeout performed Patient Re-evaluated:Patient Re-evaluated prior to inductionOxygen Delivery Method: Circle system utilized Preoxygenation: Pre-oxygenation with 100% oxygen Intubation Type: IV induction Ventilation: Mask ventilation without difficulty Laryngoscope Size: Mac and 3 Grade View: Grade II Tube type: Oral Tube size: 7.5 mm Number of attempts: 1 Airway Equipment and Method: Stylet Placement Confirmation: ETT inserted through vocal cords under direct vision,  positive ETCO2 and breath sounds checked- equal and bilateral Secured at: 22 cm Tube secured with: Tape Dental Injury: Teeth and Oropharynx as per pre-operative assessment

## 2014-05-23 NOTE — Progress Notes (Signed)
Utilization review completed.  

## 2014-05-23 NOTE — Transfer of Care (Signed)
Immediate Anesthesia Transfer of Care Note  Patient: Rhonda Park  Procedure(s) Performed: Procedure(s): CERVICAL FIVE SIX ANTERIOR CERVICAL DECOMPRESSION/DISCECTOMY FUSION 1 LEVEL (N/A)  Patient Location: PACU  Anesthesia Type:General  Level of Consciousness: awake, alert  and oriented  Airway & Oxygen Therapy: Patient Spontanous Breathing and Patient connected to nasal cannula oxygen  Post-op Assessment: Report given to PACU RN, Post -op Vital signs reviewed and stable and Patient moving all extremities  Post vital signs: Reviewed and stable  Complications: No apparent anesthesia complications

## 2014-05-23 NOTE — Progress Notes (Signed)
Orthopedic Tech Progress Note Patient Details:  Rhonda SeveranceBrigitte Bradfield 1941/02/21 [id number] Delivered to Neuro OR desk Ortho Devices Type of Ortho Device: Soft collar Ortho Device/Splint Interventions: Ordered   [place] R Thompson 05/23/2014, 7:50 AM

## 2014-05-23 NOTE — H&P (Signed)
Rhonda Park is an 73 y.o. female.   Chief Complaint: Neck pain HPI:  71-year-old female with neck pain with radiation to both upper MB is an associated numbness and paresthesias. Workup demonstrates evidence of a significant C5-6 disc herniation with associated spondylosis causing marked spinal cord compression. Patient presents now for C5-6 anterior cervical discectomy and fusion in hopes of improving your symptoms.  Past Medical History  Diagnosis Date  . Cardiomegaly   . Cervicalgia   . Chronic airway obstruction, not elsewhere classified   . Unspecified congenital cystic kidney disease   . Type II or unspecified type diabetes mellitus without mention of complication, not stated as uncontrolled   . Unspecified hypothyroidism   . Unspecified essential hypertension     With evidence of hyperaldosteronism with severe hypokalemia. 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  Marland Kitchen Hyperlipidemia   . Stroke   . Coronary artery disease     Remote RCA stent. Most recent cardiac catheterization in May of 2010 showed an ejection fraction of 65% with 50% ostial left main stenosis and 90% distal LAD stenosis close to the apex  . Coronary atherosclerosis of unspecified type of vessel, native or graft   . Shortness of breath     with exertion  . Anxiety   . GERD (gastroesophageal reflux disease)   . Headache(784.0)   . Arthritis   . Anemia     Past Surgical History  Procedure Laterality Date  . Tonsillectomy    . Hysterectomy    . Cholecystectomy    . Back surgery    . Appendectomy    . Knee surgery    . Posterior segment      unlisted procedure  . Cardiac catheter[facility]  03/2009    [facility]  . Cardiac catheterization  05/2007    [facility]  . Cardiac catheterization  05/2009    ''  . Cardiac catheterization  07/2005    ''  . Abdominal hysterectomy      Family History  Problem Relation Age of Onset  . Heart attack Mother    Social History:  reports that she has been smoking Cigarettes.   She has a 5 pack-year smoking history. She does not have any smokeless tobacco history on file. She reports that she does not drink alcohol or use illicit drugs.  Allergies:  Allergies  Allergen Reactions  . Codeine     Reports that she gets jittery and does not feel right able to tolerate hydrocodone  . Demerol [Meperidine]   . Ivp Dye [Iodinated Diagnostic Agents]   . Shellfish Allergy   . Statins     myalgia    Medications Prior to Admission  Medication Sig Dispense Refill  . amLODipine (NORVASC) 10 MG tablet Take 10 mg by mouth daily.      . Cholecalciferol (VITAMIN D-3) 5000 UNITS TABS Take by mouth daily.      . clonazePAM (KLONOPIN) 1 MG tablet Take 1 mg by mouth 2 (two) times daily.      Marland Kitchen esomeprazole (NEXIUM) 40 MG capsule Take 40 mg by mouth daily before breakfast.      . ezetimibe (ZETIA) 10 MG tablet Take 10 mg by mouth daily.      . isosorbide mononitrate (IMDUR) 60 MG 24 hr tablet Take 60 mg by mouth daily.      Marland Kitchen levothyroxine (SYNTHROID, LEVOTHROID) 100 MCG tablet Take 100 mcg by mouth daily before breakfast.      . metoprolol succinate (TOPROL-XL) 100 MG 24  hr tablet Take 100 mg by mouth daily. Take with or immediately following a meal.      . mupirocin ointment (BACTROBAN) 2 % Place 1 application into the nose 2 (two) times daily. Need an additional 2 doses to complete 10 doses      . sertraline (ZOLOFT) 100 MG tablet Take 100 mg by mouth daily.      . sitaGLIPtin (JANUVIA) 100 MG tablet Take 100 mg by mouth daily.      Marland Kitchen spironolactone (ALDACTONE) 25 MG tablet Take 25 mg by mouth 2 (two) times daily.       . clopidogrel (PLAVIX) 75 MG tablet Take 1 tablet (75 mg total) by mouth daily with breakfast.  30 tablet  6    Results for orders placed during the hospital encounter of 05/23/14 (from the past 48 hour(s))  BASIC METABOLIC PANEL     Status: Abnormal   Collection Time    05/23/14  6:09 AM      Result Value Ref Range   Sodium 142  137 - 147 mEq/L   Potassium  5.3  3.7 - 5.3 mEq/L   Chloride 105  96 - 112 mEq/L   CO2 25  19 - 32 mEq/L   Glucose, Bld 106 (*) 70 - 99 mg/dL   BUN 17  6 - 23 mg/dL   Creatinine, Ser 1.12 (*) 0.50 - 1.10 mg/dL   Calcium 9.2  8.4 - 10.5 mg/dL   GFR calc non Af Amer 49 (*) >90 mL/min   GFR calc Af Amer 56 (*) >90 mL/min   Comment: (NOTE)     The eGFR has been calculated using the CKD EPI equation.     This calculation has not been validated in all clinical situations.     eGFR's persistently <90 mL/min signify possible Chronic Kidney     Disease.   Anion gap 12  5 - 15  GLUCOSE, CAPILLARY     Status: Abnormal   Collection Time    05/23/14  6:29 AM      Result Value Ref Range   Glucose-Capillary 105 (*) 70 - 99 mg/dL   No results found.  Review of Systems  Constitutional: Negative.   HENT: Negative.   Eyes: Negative.   Respiratory: Negative.   Cardiovascular: Negative.   Gastrointestinal: Negative.   Genitourinary: Negative.   Musculoskeletal: Negative.   Skin: Negative.   Neurological: Negative.   Endo/Heme/Allergies: Negative.   Psychiatric/Behavioral: Negative.     Blood pressure 104/31, pulse 50, temperature 97.6 F (36.4 C), temperature source Oral, resp. rate 18, weight 81.647 kg (180 lb), SpO2 96.00%. Physical Exam  Constitutional: She is oriented to person, place, and time. She appears well-developed and well-nourished. No distress.  HENT:  Head: Normocephalic and atraumatic.  Right Ear: External ear normal.  Left Ear: External ear normal.  Nose: Nose normal.  Mouth/Throat: Oropharynx is clear and moist. No oropharyngeal exudate.  Eyes: Conjunctivae are normal. Pupils are equal, round, and reactive to light. Right eye exhibits no discharge. Left eye exhibits no discharge.  Neck: Normal range of motion. Neck supple. No tracheal deviation present. No thyromegaly present.  Cardiovascular: Normal rate, regular rhythm, normal heart sounds and intact distal pulses.  Exam reveals no friction rub.    No murmur heard. Respiratory: Effort normal and breath sounds normal. No respiratory distress. She has no wheezes.  GI: Soft. Bowel sounds are normal. She exhibits no distension. There is no tenderness.  Musculoskeletal: Normal range of motion.  She exhibits no edema and no tenderness.  Neurological: She is alert and oriented to person, place, and time. She has normal reflexes. No cranial nerve deficit. Coordination normal.  Skin: Skin is warm and dry. No rash noted. She is not diaphoretic. No erythema. No pallor.  Psychiatric: She has a normal mood and affect. Her behavior is normal. Judgment and thought content normal.     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 Plan C5-6 ACDF with interbody cage, local autograft, and anterior plate instrumentation. Risks and benefits of been explained. Patient wishes to proceed.  Danarius Mcconathy A 05/23/2014, 7:36 AM

## 2014-05-23 NOTE — Op Note (Signed)
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  Asst. Surgeon: None  Anesthesia: General  Indication: 73 year old female with neck and bilateral upper extremity pain paresthesias and weakness consistent with a cervical myelopathy. Workup demonstrates evidence of marked cervical stenosis with spinal cord compression at C5-6. Patient presents now for C5-6 anterior cervical decompression and fusion.  Operative note: After induction anesthesia, patient positioned supine with neck slightly extended and held in place of halter traction. Patient's anterior cervical region was prepped and draped sterilely. Incision was made overlying C5-6. This carried down sharply to the platysma. This was then divided vertically and dissection proceeded on the medial border of the sternocleidomastoid muscle and carotid sheath. Trachea and esophagus are mobilized and retracted towards the left. Prevertebral fascia stripped within her spinal column. Longus hallucis elevated bilaterally using electric artery. Deep self retaining traction placed intraoperative x-rays taken level was confirmed. The space at C5-6 was incised with 15 blade. Discectomies and performed using pituitary rongeurs for tobacco progress Kerrison rongeurs the high-speed drill. All elements the disc removed down to level posterior annulus. Microscope was then brought into the field. Remaining aspects of annulus and osteophytes removed using high-speed drill down 12 posterior longitudinal ligament. Posterior lateral and was elevated and resected so fashion using Kerrison rongeurs. Underlying thecal sac was identified. Wide central  decompression and perform undercutting the bodies of C5 and C6. Decompression MCH are of foramen. Wide anterior foraminotomies were performed on course exiting C6 nerve roots bilaterally. At this point a very thorough discectomy been achieved. There is no his injury to thecal sac and nerve roots. Wound is then irrigated with MI solution. Gelfoam placed topically for hemostasis which Ascent be good. 7 mm peek interbody cage was then packed with locally harvested autograft and packed into place and recessed roughly 1 mm from the anterior cortical margin of C5 and C6. 25 mm Atlantis anterior cervical plate was then placed over the C5 and C6 levels. This infection or fluoroscopic guidance using 13 mm variable-angle screws. All 4 screws given a final tightening be solidly within bone. Locking screws engaged both levels. Final images real good position the varus or proper upper level with normal lamina spine. Wounds that area one final time. Hemostasis was achieved with bipolar chart was and close in layers. Steri-Strips her dressing were applied. No apparent outpatient. Patient tolerated the procedure well and she returns to the recovery room postop

## 2014-05-23 NOTE — Plan of Care (Signed)
Problem: Consults Goal: Diagnosis - Spinal Surgery Outcome: Completed/Met Date Met:  05/23/14 Cervical Spine Fusion     

## 2014-05-24 LAB — GLUCOSE, CAPILLARY: Glucose-Capillary: 111 mg/dL — ABNORMAL HIGH (ref 70–99)

## 2014-05-24 MED ORDER — CYCLOBENZAPRINE HCL 10 MG PO TABS: 10.0000 mg | ORAL_TABLET | Freq: Three times a day (TID) | ORAL | Status: DC | PRN

## 2014-05-24 MED ORDER — TRAMADOL HCL 50 MG PO TABS: 50.0000 mg | ORAL_TABLET | Freq: Four times a day (QID) | ORAL | Status: DC | PRN

## 2014-05-24 NOTE — Discharge Summary (Signed)
Physician Discharge Summary  Patient ID: Rhonda SeveranceBrigitte Park MRN: [id number] DOB/AGE: 71-Jan-1942 71 y.o.  Admit date: 05/23/2014 Discharge date: 05/24/2014  Admission Diagnoses:  Discharge Diagnoses:  Principal Problem:   Spinal stenosis in cervical region Active Problems:   Stenosis of cervical spine with myelopathy   Discharged Condition: good  Hospital Course: Patient admitted to the hospital where she underwent uncomplicated C5-6 anterior cervical decompression and fusion. Postoperatively she is doing her well. Preoperative neck and arm pain much better. She is up ambulating and ready for discharge home.  Consults:   Significant Diagnostic Studies:   Treatments:   Discharge Exam: Blood pressure 156/72, pulse 65, temperature 98.8 F (37.1 C), temperature source Oral, resp. rate 18, weight 81.647 kg (180 lb), SpO2 93.00%. Awake and alert. Oriented and appropriate. Motor and sensory function intact. Wound clean and dry. Chest and abdomen benign.  Disposition:      Medication List         amLODipine 10 MG tablet  Commonly known as:  NORVASC  Take 10 mg by mouth daily.     clonazePAM 1 MG tablet  Commonly known as:  KLONOPIN  Take 1 mg by mouth 2 (two) times daily.     clopidogrel 75 MG tablet  Commonly known as:  PLAVIX  Take 1 tablet (75 mg total) by mouth daily with breakfast.     cyclobenzaprine 10 MG tablet  Commonly known as:  FLEXERIL  Take 1 tablet (10 mg total) by mouth 3 (three) times daily as needed for muscle spasms.     esomeprazole 40 MG capsule  Commonly known as:  NEXIUM  Take 40 mg by mouth daily before breakfast.     ezetimibe 10 MG tablet  Commonly known as:  ZETIA  Take 10 mg by mouth daily.     isosorbide mononitrate 60 MG 24 hr tablet  Commonly known as:  IMDUR  Take 60 mg by mouth daily.     levothyroxine 100 MCG tablet  Commonly known as:  SYNTHROID, LEVOTHROID  Take 100 mcg by mouth daily before breakfast.     metoprolol succinate  100 MG 24 hr tablet  Commonly known as:  TOPROL-XL  Take 100 mg by mouth daily. Take with or immediately following a meal.     mupirocin ointment 2 %  Commonly known as:  BACTROBAN  Place 1 application into the nose 2 (two) times daily. Need an additional 2 doses to complete 10 doses     sertraline 100 MG tablet  Commonly known as:  ZOLOFT  Take 100 mg by mouth daily.     sitaGLIPtin 100 MG tablet  Commonly known as:  JANUVIA  Take 100 mg by mouth daily.     spironolactone 25 MG tablet  Commonly known as:  ALDACTONE  Take 25 mg by mouth 2 (two) times daily.     traMADol 50 MG tablet  Commonly known as:  ULTRAM  Take 1-2 tablets (50-100 mg total) by mouth every 6 (six) hours as needed for moderate pain.     Vitamin D-3 5000 UNITS Tabs  Take by mouth daily.         Signed: Cotey Rakes A 05/24/2014, 10:13 AM

## 2014-05-24 NOTE — Progress Notes (Signed)
Patient discharged home with family. Patient and family stated understanding of discharged instructions given at discharge. Aisha I

## 2014-05-24 NOTE — Discharge Instructions (Signed)

## 2014-05-26 ENCOUNTER — Encounter (HOSPITAL_COMMUNITY): Payer: Self-pay | Admitting: Neurosurgery

## 2014-05-29 ENCOUNTER — Other Ambulatory Visit: Payer: Self-pay | Admitting: *Deleted

## 2014-05-29 MED ORDER — CLOPIDOGREL BISULFATE 75 MG PO TABS: 75.0000 mg | ORAL_TABLET | Freq: Every day | ORAL | Status: DC

## 2014-05-29 NOTE — Telephone Encounter (Signed)
Requested Prescriptions   Signed Prescriptions Disp Refills  . clopidogrel (PLAVIX) 75 MG tablet 30 tablet 3    Sig: Take 1 tablet (75 mg total) by mouth daily with breakfast.    Authorizing Provider: ARIDA, MUHAMMAD A    Ordering User: Rosibel Giacobbe C    

## 2014-05-29 NOTE — Telephone Encounter (Signed)
Requested Prescriptions   Signed Prescriptions Disp Refills  . clopidogrel (PLAVIX) 75 MG tablet 90 tablet 3    Sig: Take 1 tablet (75 mg total) by mouth daily with breakfast.    Authorizing Provider: Lorine BearsARIDA, MUHAMMAD A    Ordering User: Kendrick FriesLOPEZ, Aki Abalos C

## 2014-06-05 ENCOUNTER — Encounter: Payer: Self-pay | Admitting: Cardiovascular Disease

## 2014-06-05 ENCOUNTER — Ambulatory Visit (INDEPENDENT_AMBULATORY_CARE_PROVIDER_SITE_OTHER): Payer: Medicare Other | Admitting: Cardiovascular Disease

## 2014-06-05 VITALS — BP 114/62 | HR 65 | Ht 66.0 in | Wt 178.2 lb

## 2014-06-05 DIAGNOSIS — I10 Essential (primary) hypertension: Secondary | ICD-10-CM

## 2014-06-05 DIAGNOSIS — I209 Angina pectoris, unspecified: Secondary | ICD-10-CM

## 2014-06-05 DIAGNOSIS — E785 Hyperlipidemia, unspecified: Secondary | ICD-10-CM

## 2014-06-05 DIAGNOSIS — I2510 Atherosclerotic heart disease of native coronary artery without angina pectoris: Secondary | ICD-10-CM

## 2014-06-05 DIAGNOSIS — I25119 Atherosclerotic heart disease of native coronary artery with unspecified angina pectoris: Secondary | ICD-10-CM

## 2014-06-05 NOTE — Assessment & Plan Note (Addendum)
She is currently on Zetia with intolerance to statins.

## 2014-06-05 NOTE — Assessment & Plan Note (Signed)
She has no symptoms of angina. Continue medical therapy. 

## 2014-06-05 NOTE — Assessment & Plan Note (Signed)
Blood pressure is under excellent control on current medications. 

## 2014-06-05 NOTE — Progress Notes (Signed)
HPI  This is a 73 year old female who is here today for a followup visit. She has known history of coronary artery disease with previous stenting of the right coronary artery. Most recent cardiac catheterization in 2010 showed moderate ostial left main stenosis with 90% distal LAD stenosis close to the apex and mild instent restenosis in the right coronary artery. She has known history of refractory hypertension with previous evidence of hyperaldosteronism with severe hypokalemia. Imaging was suggestive of adrenal adenoma. She has been treated successfully with spironolactone. She presented in 10/2013 to [facility] with headache and hypertensive urgency. 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 Echocardiogram showed normal LV systolic function with ? moderate aortic insufficiency. Carotid duplex showed mild less than 50% disease bilaterally.  She underwent spinal next recently without complications. She is overall doing well and denies chest pain or shortness of breath. Blood pressure has been well-controlled.   Allergies  Allergen Reactions  . Codeine     Reports that she gets jittery and does not feel right able to tolerate hydrocodone  . Demerol [Meperidine]   . Ivp Dye [Iodinated Diagnostic Agents]   . Shellfish Allergy   . Statins     myalgia     Current Outpatient Prescriptions on File Prior to Visit  Medication Sig Dispense Refill  . amLODipine (NORVASC) 10 MG tablet Take 10 mg by mouth daily.      . Cholecalciferol (VITAMIN D-3) 5000 UNITS TABS Take by mouth daily.      . clonazePAM (KLONOPIN) 1 MG tablet Take 1 mg by mouth 2 (two) times daily.      . clopidogrel (PLAVIX) 75 MG tablet Take 1 tablet (75 mg total) by mouth daily with breakfast.  90 tablet  3  . esomeprazole (NEXIUM) 40 MG capsule Take 40 mg by mouth daily before breakfast.      . ezetimibe (ZETIA) 10 MG tablet Take 10 mg by mouth daily.      . isosorbide mononitrate (IMDUR) 60 MG 24 hr tablet  Take 60 mg by mouth daily.      Marland Kitchen levothyroxine (SYNTHROID, LEVOTHROID) 100 MCG tablet Take 100 mcg by mouth daily before breakfast.      . metoprolol succinate (TOPROL-XL) 100 MG 24 hr tablet Take 100 mg by mouth daily. Take with or immediately following a meal.      . sertraline (ZOLOFT) 100 MG tablet Take 100 mg by mouth daily.      . sitaGLIPtin (JANUVIA) 100 MG tablet Take 100 mg by mouth daily.      Marland Kitchen spironolactone (ALDACTONE) 25 MG tablet Take 25 mg by mouth 2 (two) times daily.        No current facility-administered medications on file prior to visit.     Past Medical History  Diagnosis Date  . Cardiomegaly   . Cervicalgia   . Chronic airway obstruction, not elsewhere classified   . Unspecified congenital cystic kidney disease   . Type II or unspecified type diabetes mellitus without mention of complication, not stated as uncontrolled   . Unspecified hypothyroidism   . Unspecified essential hypertension     With evidence of hyperaldosteronism with severe hypokalemia. 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  Marland Kitchen Hyperlipidemia   . Stroke   . Coronary artery disease     Remote RCA stent. Most recent cardiac catheterization in May of 2010 showed an ejection fraction of 65% with 50% ostial left main stenosis and 90% distal LAD stenosis close  to the apex  . Coronary atherosclerosis of unspecified type of vessel, native or graft   . Shortness of breath     with exertion  . Anxiety   . GERD (gastroesophageal reflux disease)   . Headache(784.0)   . Arthritis   . Anemia      Past Surgical History  Procedure Laterality Date  . Tonsillectomy    . Hysterectomy    . Cholecystectomy    . Back surgery    . Appendectomy    . Knee surgery    . Posterior segment      unlisted procedure  . [facility]  . Cardiac catheterization  05/2007    [facility]  . Cardiac catheterization  05/2009    ''  . Cardiac catheterization  07/2005    ''  . Abdominal  hysterectomy    . Anterior cervical decomp/discectomy fusion N/A 05/23/2014    Procedure: CERVICAL FIVE SIX ANTERIOR CERVICAL DECOMPRESSION/DISCECTOMY FUSION 1 LEVEL;  Surgeon: Temple PaciniHenry A Pool, MD;  Location: [facility];  Service: Neurosurgery;  Laterality: N/A;     Family History  Problem Relation Age of Onset  . Heart attack Mother      History   Social History  . Marital Status: Divorced    Spouse Name: N/A    Number of Children: N/A  . Years of Education: N/A   Occupational History  . Not on file.   Social History Main Topics  . Smoking status: Current Every Day Smoker -- 0.25 packs/day for 20 years    Types: Cigarettes  . Smokeless tobacco: Not on file  . Alcohol Use: No  . Drug Use: No  . Sexual Activity: Not on file   Other Topics Concern  . Not on file   Social History Narrative  . No narrative on file     ROS A 10 point review of system was performed. It is negative other than that mentioned in the history of present illness.   PHYSICAL EXAM   BP 114/62  Pulse 65  Ht 5\' 6"  (1.676 m)  Wt 178 lb 4 oz (80.854 kg)  BMI 28.78 kg/m2 Constitutional: She is oriented to person, place, and time. She appears well-developed and well-nourished. No distress.  HENT: No nasal discharge.  Head: Normocephalic and atraumatic.  Eyes: Pupils are equal and round. No discharge.  Neck: Normal range of motion. Neck supple. No JVD present. No thyromegaly present.  Cardiovascular: Normal rate, regular rhythm, normal heart sounds. Exam reveals no gallop and no friction rub. No murmur heard.  Pulmonary/Chest: Effort normal and breath sounds normal. No stridor. No respiratory distress. She has no wheezes. She has no rales. She exhibits no tenderness.  Abdominal: Soft. Bowel sounds are normal. She exhibits no distension. There is no tenderness. There is no rebound and no guarding.  Musculoskeletal: Normal range of motion. She exhibits no edema and no tenderness.  Neurological: She  is alert and oriented to person, place, and time. Coordination normal.  Skin: Skin is warm and dry. No rash noted. She is not diaphoretic. No erythema. No pallor.  Psychiatric: She has a normal mood and affect. Her behavior is normal. Judgment and thought content normal.     EKG: Sinus  Bradycardia  -Nonspecific ST depression  -Nondiagnostic.   ABNORMAL    ASSESSMENT AND PLAN

## 2014-06-05 NOTE — Patient Instructions (Signed)
Continue same medications.   Your physician wants you to follow-up in: 6 months.  You will receive a reminder letter in the mail two months in advance. If you don't receive a letter, please call our office to schedule the follow-up appointment.  

## 2014-07-03 DIAGNOSIS — W19XXXA Unspecified fall, initial encounter: Secondary | ICD-10-CM | POA: Insufficient documentation

## 2014-07-03 DIAGNOSIS — H539 Unspecified visual disturbance: Secondary | ICD-10-CM | POA: Insufficient documentation

## 2014-07-03 DIAGNOSIS — R262 Difficulty in walking, not elsewhere classified: Secondary | ICD-10-CM | POA: Insufficient documentation

## 2014-07-03 DIAGNOSIS — R413 Other amnesia: Secondary | ICD-10-CM | POA: Insufficient documentation

## 2014-07-03 DIAGNOSIS — Z8673 Personal history of transient ischemic attack (TIA), and cerebral infarction without residual deficits: Secondary | ICD-10-CM | POA: Insufficient documentation

## 2014-07-03 DIAGNOSIS — E119 Type 2 diabetes mellitus without complications: Secondary | ICD-10-CM | POA: Insufficient documentation

## 2014-07-03 DIAGNOSIS — I69320 Aphasia following cerebral infarction: Secondary | ICD-10-CM | POA: Insufficient documentation

## 2014-07-03 DIAGNOSIS — R296 Repeated falls: Secondary | ICD-10-CM | POA: Insufficient documentation

## 2014-07-03 DIAGNOSIS — I69398 Other sequelae of cerebral infarction: Secondary | ICD-10-CM

## 2014-07-14 ENCOUNTER — Ambulatory Visit: Payer: Medicare Other | Admitting: Podiatry

## 2014-07-25 ENCOUNTER — Ambulatory Visit: Payer: Self-pay | Admitting: Podiatry

## 2014-07-28 ENCOUNTER — Ambulatory Visit (INDEPENDENT_AMBULATORY_CARE_PROVIDER_SITE_OTHER): Payer: Medicare Other | Admitting: Podiatry

## 2014-07-28 ENCOUNTER — Encounter: Payer: Self-pay | Admitting: Podiatry

## 2014-07-28 VITALS — BP 114/62 | HR 65 | Resp 16 | Ht 66.0 in | Wt 178.0 lb

## 2014-07-28 DIAGNOSIS — B351 Tinea unguium: Secondary | ICD-10-CM | POA: Diagnosis not present

## 2014-07-28 DIAGNOSIS — M79609 Pain in unspecified limb: Secondary | ICD-10-CM

## 2014-07-28 DIAGNOSIS — I2510 Atherosclerotic heart disease of native coronary artery without angina pectoris: Secondary | ICD-10-CM | POA: Diagnosis not present

## 2014-07-28 DIAGNOSIS — M79673 Pain in unspecified foot: Secondary | ICD-10-CM

## 2014-07-28 NOTE — Progress Notes (Signed)
Subjective:     Patient ID: Rhonda Park, female   DOB: 12/18/1940, 73 y.o.   MRN: [id number]  HPI patient presents with painful nailbeds 1-5 both feet has been a long-term diabetic and cannot take care of her toenails teary at   Review of Systems  All other systems reviewed and are negative.      Objective:   Physical Exam  Nursing note and vitals reviewed. Constitutional: She is oriented to person, place, and time.  Cardiovascular: Intact distal pulses.   Musculoskeletal: Normal range of motion.  Neurological: She is oriented to person, place, and time.  Skin: Skin is warm.   neurovascular status is found to be intact with muscle strength adequate and range of motion mildly diminished. Patient is noted to have thick nailbeds 1-5 both feet that are painful when pressed and making it difficult for her to wear shoe gear comfortably. 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

## 2014-07-28 NOTE — Progress Notes (Signed)
   Subjective:    Patient ID: Rhonda Park, female    DOB: Nov 03, 1941, 73 y.o.   MRN: [id number]  HPI Comments: My toenails on the right foot , thick and yellow and real hard for me to cut, i am diabetic , been years . Blood sugar been good      Review of Systems  Endocrine:       Diabetes   All other systems reviewed and are negative.      Objective:   Physical Exam        Assessment & Plan:

## 2014-10-18 ENCOUNTER — Telehealth: Payer: Self-pay | Admitting: *Deleted

## 2014-10-18 NOTE — Telephone Encounter (Signed)
Dr. Glenis SmokerNeimeyer called and asked how soon patient could be seen by Dr. Kirke CorinArida for chest pain  I informed him Eula ListenRyan Dunn PA has an opening 10/24/14 He stated that appointment will be fine  Patient is not currently having chest pain He advised patient to go to the ED if she has pain between now and then

## 2014-10-18 NOTE — Telephone Encounter (Signed)
This encounter was created in error - please disregard.

## 2014-10-24 ENCOUNTER — Ambulatory Visit (INDEPENDENT_AMBULATORY_CARE_PROVIDER_SITE_OTHER): Payer: Medicare Other | Admitting: Physician Assistant

## 2014-10-24 ENCOUNTER — Encounter: Payer: Self-pay | Admitting: Physician Assistant

## 2014-10-24 ENCOUNTER — Telehealth: Payer: Self-pay

## 2014-10-24 VITALS — BP 120/72 | HR 62 | Ht 66.0 in | Wt 181.8 lb

## 2014-10-24 DIAGNOSIS — I10 Essential (primary) hypertension: Secondary | ICD-10-CM

## 2014-10-24 DIAGNOSIS — I2510 Atherosclerotic heart disease of native coronary artery without angina pectoris: Secondary | ICD-10-CM

## 2014-10-24 DIAGNOSIS — E785 Hyperlipidemia, unspecified: Secondary | ICD-10-CM

## 2014-10-24 DIAGNOSIS — R0789 Other chest pain: Secondary | ICD-10-CM

## 2014-10-24 DIAGNOSIS — I25119 Atherosclerotic heart disease of native coronary artery with unspecified angina pectoris: Secondary | ICD-10-CM

## 2014-10-24 MED ORDER — ISOSORBIDE MONONITRATE ER 60 MG PO TB24: 90.0000 mg | ORAL_TABLET | Freq: Every day | ORAL | Status: DC

## 2014-10-24 NOTE — Telephone Encounter (Signed)
Clarified that pt to receive 90 day supply.

## 2014-10-24 NOTE — Progress Notes (Signed)
Patient Name: Rhonda Park, DOB 06-29-1941, MRN [id number]  Date of Encounter: 10/24/2014  Primary Care Provider:  Evelene CroonNIEMEYER, MEINDERT, MD Primary Cardiologist:  Dr. Kirke CorinArida, MD  Patient Profile:  73 y.o. female with history below presents for routine evaluation of chest pain.     Problem List:   Past Medical History  Diagnosis Date  . Cardiomegaly   . Cervicalgia   . Chronic airway obstruction, not elsewhere classified   . Unspecified congenital cystic kidney disease   . Type II or unspecified type diabetes mellitus without mention of complication, not stated as uncontrolled   . Unspecified hypothyroidism   . Unspecified essential hypertension     With evidence of hyperaldosteronism with severe hypokalemia. 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  Marland Kitchen. Hyperlipidemia   . Stroke   . Coronary artery disease     Remote RCA stent. Most recent cardiac catheterization in May of 2010 showed an ejection fraction of 65% with 50% ostial left main stenosis and 90% distal LAD stenosis close to the apex  . Coronary atherosclerosis of unspecified type of vessel, native or graft   . Shortness of breath     with exertion  . Anxiety   . GERD (gastroesophageal reflux disease)   . Headache(784.0)   . Arthritis   . Anemia    Past Surgical History  Procedure Laterality Date  . Tonsillectomy    . Hysterectomy    . Cholecystectomy    . Back surgery    . Appendectomy    . Knee surgery    . Posterior segment      unlisted procedure  . [facility]  . Cardiac catheterization  05/2007    [facility]  . Cardiac catheterization  05/2009    ''  . Cardiac catheterization  07/2005    ''  . Abdominal hysterectomy    . Anterior cervical decomp/discectomy fusion N/A 05/23/2014    Procedure: CERVICAL FIVE SIX ANTERIOR CERVICAL DECOMPRESSION/DISCECTOMY FUSION 1 LEVEL;  Surgeon: Temple PaciniHenry A Pool, MD;  Location: [facility];  Service: Neurosurgery;  Laterality: N/A;      Allergies:  Allergies  Allergen Reactions  . Codeine     Reports that she gets jittery and does not feel right able to tolerate hydrocodone  . Demerol [Meperidine]   . Ivp Dye [Iodinated Diagnostic Agents]   . Shellfish Allergy   . Statins     myalgia     HPI:  73 y.o. female with the above problem list. She has known history of coronary artery disease with previous stenting of the right coronary artery. Most recent cardiac catheterization in 2010 showed moderate ostial left main stenosis with 90% distal LAD stenosis close to the apex and mild instent restenosis in the right coronary artery. She has known history of refractory hypertension with previous evidence of hyperaldosteronism with severe hypokalemia. Imaging was suggestive of adrenal adenoma. She has been treated successfully with spironolactone. [facility] with [facility] and hypertensive urgency. 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 Echocardiogram showed normal LV systolic function with ? moderate aortic insufficiency. Carotid duplex showed mild less than 50% disease bilaterally.  She was recently seen by her PCP for chest heaviness and leg weakness x several weeks. She was scheduled for evaluation of this. Per her PCP records she underwent an echo last week at their office that showed an EF of 65%, trace AI, and mild MR. She also had  carotid dopplers done that showed right sided stenosis of 60-79%. Her metoprolol was decreased by PCP 2/2 bradycardia. She reports suffering a fall several weeks ago and has been moving slowly since that time. Her lower back and legs have been sore. They are getting better. However, since this fall she notes exertional chest heaviness. She reports chest heaviness associated with walking distances of 100 feet. Chest heaviness improves with rest, however it does take several minutes for the symptoms to improve. Associated symptoms include SOB. No  associated diaphoresis, nausea, vomiting, presyncope, or syncope. Pain is located midsternum and does not radiate. She has not tried any medications. She is currently on Imdur 60 mg daily.      Home Medications:  Prior to Admission medications   Medication Sig Start Date End Date Taking? Authorizing Provider  amLODipine (NORVASC) 10 MG tablet Take 10 mg by mouth daily.    Historical Provider, MD  Cholecalciferol (VITAMIN D-3) 5000 UNITS TABS Take by mouth daily.    Historical Provider, MD  clonazePAM (KLONOPIN) 1 MG tablet Take 1 mg by mouth 2 (two) times daily.    Historical Provider, MD  clopidogrel (PLAVIX) 75 MG tablet Take 1 tablet (75 mg total) by mouth daily with breakfast. 05/29/14   Iran OuchMuhammad A Arida, MD  esomeprazole (NEXIUM) 40 MG capsule Take 40 mg by mouth daily before breakfast.    Historical Provider, MD  ezetimibe (ZETIA) 10 MG tablet Take 10 mg by mouth daily.    Historical Provider, MD  isosorbide mononitrate (IMDUR) 60 MG 24 hr tablet Take 60 mg by mouth daily.    Historical Provider, MD  levothyroxine (SYNTHROID, LEVOTHROID) 100 MCG tablet Take 100 mcg by mouth daily before breakfast.    Historical Provider, MD  metoprolol succinate (TOPROL-XL) 100 MG 24 hr tablet Take 100 mg by mouth daily. Take with or immediately following a meal.    Historical Provider, MD  sertraline (ZOLOFT) 100 MG tablet Take 100 mg by mouth daily.    Historical Provider, MD  sitaGLIPtin (JANUVIA) 100 MG tablet Take 100 mg by mouth daily.    Historical Provider, MD  spironolactone (ALDACTONE) 25 MG tablet Take 25 mg by mouth 2 (two) times daily.     Historical Provider, MD     Weights: Filed Weights   10/24/14 1334  Weight: 181 lb 12 oz (82.441 kg)     Review of Systems:  As above.  All other systems reviewed and are otherwise negative except as noted above.  Physical Exam:  Blood pressure 120/72, pulse 62, height 5\' 6"  (1.676 m), weight 181 lb 12 oz (82.441 kg).  General: Pleasant,  NAD Psych: Normal affect. Neuro: Alert and oriented X 3. Moves all extremities spontaneously. HEENT: Normal  Neck: Supple without bruits or JVD. Lungs:  Resp regular and unlabored, CTA. Heart: RRR no s3, s4, or murmurs. Abdomen: Soft, non-tender, non-distended, BS + x 4.  Extremities: No clubbing, cyanosis or edema. DP/PT/Radials 2+ and equal bilaterally.   Accessory Clinical Findings:  EKG - NSR, 62, normal axis, diffuse nonspecific st/t changes (see on previous studies)  Assessment & Plan:  1. Chest pain with moderate risk of cardiac etiology: -Schedule Lexiscan Myoview to evaluate for high risk ischemia  -Echo performed by PCP showed EF 65%, mild AI, mild MR (get full echo report from PCP) -May need repeat cardiac catheterization pending the above  -Increase Imdur to 90 mg daily   2. CAD s/p remote RCA stenting and medical therapy as above: -Toprol  Xl was decreased to 50 mg recently by PCP 2/2 bradycardia with rates in the low 60s -On Plavix 2/2 h/o stroke  3. HTN: -Well controlled -Imdur increased as above  4. HLD: -Intolerant to statins -Continue Zetia   Eula Listen, PA-C [facility] [address] [place], [place] [address] [phone] [facility] 10/24/2014, 2:19 PM

## 2014-10-24 NOTE — Patient Instructions (Addendum)
-  Go up on you Imdur to 90 mg daily -You are scheduled for a stress test  -Follow up in 1 month  [facility] MYOVIEW  Your caregiver has ordered a Stress Test with nuclear imaging. The purpose of this test is to evaluate the blood supply to your heart muscle. This procedure is referred to as a "Non-Invasive Stress Test." This is because other than having an IV started in your vein, nothing is inserted or "invades" your body. Cardiac stress tests are done to find areas of poor blood flow to the heart by determining the extent of coronary artery disease (CAD). Some patients exercise on a treadmill, which naturally increases the blood flow to your heart, while others who are  unable to walk on a treadmill due to physical limitations have a pharmacologic/chemical stress agent called Lexiscan . This medicine will mimic walking on a treadmill by temporarily increasing your coronary blood flow.   Please note: these test may take anywhere between 2-4 hours to complete  PLEASE REPORT TO [facility]ICAL [facility]ALL ENTRANCE  THE VOLUNTEERS AT THE FIRST DESK WILL DIRECT YOU WHERE TO GO  Date of Procedure:________Friday, Dec 18_______  Arrival Time for Procedure:_____7:15am__________  Instructions regarding medication:   __X__:  Hold betablocker(s) night before procedure and morning of procedure: METOPROLOL   PLEASE NOTIFY THE OFFICE AT LEAST 24 HOURS IN ADVANCE IF YOU ARE UNABLE TO KEEP YOUR APPOINTMENT.  [phone] AND  PLEASE NOTIFY N[facility]AR [facility]ICINE AT [facility] AT LEAST 24 HOURS IN ADVANCE IF YOU ARE UNABLE TO KEEP YOUR APPOINTMENT. [phone]  How to prepare for your Myoview test:  1. Do not eat or drink after midnight 2. No caffeine for 24 hours prior to test 3. No smoking 24 hours prior to test. 4. Your medication may be taken with water.  If your doctor stopped a medication because of this test, do not take that medication. 5. Ladies, please do not wear dresses.  Skirts or pants are appropriate. Please  wear a short sleeve shirt. 6. No perfume, cologne or lotion.

## 2014-10-24 NOTE — Telephone Encounter (Signed)
Pharmacy called regarding quantity of  Imdur. Please call.

## 2014-10-25 ENCOUNTER — Emergency Department: Payer: Self-pay | Admitting: Emergency Medicine

## 2014-10-27 ENCOUNTER — Other Ambulatory Visit: Payer: Medicare Other

## 2014-11-06 ENCOUNTER — Ambulatory Visit: Payer: Self-pay | Admitting: Physician Assistant

## 2014-11-24 ENCOUNTER — Ambulatory Visit: Payer: Medicare Other | Admitting: Cardiovascular Disease

## 2014-12-25 ENCOUNTER — Telehealth: Payer: Self-pay | Admitting: *Deleted

## 2014-12-25 NOTE — Telephone Encounter (Signed)
Pt stating that they were only able to do half the stress test. They need to know what needs to be done now.  They needed a 6 month fu but did not finish the stress test Please advise

## 2014-12-25 NOTE — Telephone Encounter (Signed)
Attempted to call patient  Phone rang then went to a busy signal x 2  12/25/14

## 2014-12-29 NOTE — Telephone Encounter (Signed)
LVM 2/19 

## 2014-12-29 NOTE — Telephone Encounter (Signed)
Care taker called retuning our call his phone number is [phone]

## 2014-12-29 NOTE — Telephone Encounter (Signed)
Lavinia SharpsMilton Fedora called back to speak with someone regarding pt.

## 2014-12-29 NOTE — Telephone Encounter (Signed)
2/19 LVM

## 2014-12-29 NOTE — Telephone Encounter (Signed)
Pt calling back to return our call. Please call patient.

## 2015-01-04 ENCOUNTER — Telehealth: Payer: Self-pay | Admitting: Physician Assistant

## 2015-01-04 NOTE — Telephone Encounter (Signed)
Patient's husband called to ask about stress test.  Patient needs to perform the 2nd part but outpatient scheduling  At [facility] needs to have a new order since the test was originally ordered in dec. 2015.  Please let patient know if they still need to complete testing  For cardiac clearance form to be completed.  Please call Patient.

## 2015-01-05 NOTE — Telephone Encounter (Signed)
LVM 2/26 

## 2015-01-05 NOTE — Telephone Encounter (Signed)
See 12/25/14 phone note. 

## 2015-01-09 ENCOUNTER — Telehealth: Payer: Self-pay

## 2015-01-09 NOTE — Telephone Encounter (Signed)
Pt called, to r/s her stress test,from December,  states she could not finish it due to her congestion.

## 2015-01-10 NOTE — Telephone Encounter (Signed)
Pt calling again about the test she did not finish, please call patient.

## 2015-01-10 NOTE — Telephone Encounter (Signed)
See 2/15 phone note  

## 2015-01-10 NOTE — Telephone Encounter (Signed)
Patient called back today during clinic  Attempted to call her back  LVM

## 2015-01-10 NOTE — Telephone Encounter (Signed)
See 2/15 phone note

## 2015-01-11 NOTE — Telephone Encounter (Signed)
Rescheduled patients Lexi scan for 01/16/15 at 1000 Patient to arrive at 0945 Reviewed instructions  Patient verbalized understanding

## 2015-01-16 ENCOUNTER — Telehealth: Payer: Self-pay

## 2015-01-16 NOTE — Telephone Encounter (Signed)
Spoke with patient  She stated she is in to much pain to have this procedure right now  She stated she will call when she is ready to reschedule

## 2015-01-16 NOTE — Telephone Encounter (Signed)
Pt friend called, states pt is not going to be able to make her Stress test appt today, due to pain in her back. I did inform [facility] at [facility]

## 2015-01-16 NOTE — Telephone Encounter (Signed)
Noted, I am sorry to hear this. I know she suffered a fall back in the fall/winter of 2015 which has slowed her down significantly. We would still like to get this done when she can get through the test. Perhaps she can see her PCP for treatment and then reschedule so this does not get permanently placed on the back burner. Please have her reschedule at her availability.

## 2015-01-31 ENCOUNTER — Telehealth: Payer: Self-pay

## 2015-01-31 NOTE — Telephone Encounter (Signed)
Pt called,states she is on antibiotics again,for congestion,  and she needs to know what to do regarding her stress test. Please call and advise

## 2015-02-02 NOTE — Telephone Encounter (Signed)
Patient stated she had to go back on antibiotics for her cough  She is still unable to complete her Lexiscan at this time  Instructed patient to call when she is well and we will schedule her  Patient verbalized understanding

## 2015-03-02 NOTE — Consult Note (Signed)
Referring Physician:  Monica Becton :   Primary Care Physician:  Monica Becton [facility], [address], [place], [place] [address], [place] [phone]  Reason for Consult: Admit Date: 12-Oct-2013  Chief Complaint: Headaches and dizziness.  Reason for Consult: CVA   History of Present Illness: History of Present Illness:   74 year old woman with a complex medical history outlined below presented to the ED with a few days of headaches and dizziness.  There is also some associated memory loss which is new per her son.  Seemed rather abrupt onset he believes.  He also noticed some difficulty with word finding and getting her words out, though this has apparently gotten better.  Due to the dizziness, she has felt unsteady when ambulating.  There is also some diffuse generalized sense of weakness.  On admission through the ED her BP was markedly elevated with SBP over 200.  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  Has not had her Brain MRI yet.  Symptoms are persistent.  Dizziness is still present, but other symptoms have gotten a little better over the past 24 hours.  Nothing makes the symptoms better or worse per se.  No other associated symptoms except as mentioned above.  No focal weakness, numbness or tingling in particular.  Symptoms are of moderate severity because they are interfering with her ability to ambulate. MEDICAL HISTORY:  artery disease,  SURGICAL HISTORY:  cystectomyartery stentrepair  SOCIAL HISTORY:  Lives at home with a daughter, smokes 3 to 4 cigarettes per day.  Denies alcohol or illicit drug usage.  HISTORY:  Father deceased with a stroke and he had history of hypertension.  Mom has a stroke history as well.  MEDICATIONS:  100 mg once daily10 mg at bedtime100 mg once daily25 mg  once dailymononitrate 60 mg extended release once daily81 mg once daily2 puff inhalation 4 times a day as needed for shortness of breath50 mg by mouth q4 hours as  needed for pain.    ACTOS, CONTRAST DYE, DEMEROL, METFORMIN, METOPROLOL, NORVASC.     ROS:  General fatigue   HEENT no complaints   Lungs SOB   Cardiac no complaints   GI no complaints   GU no complaints   Musculoskeletal no complaints   Extremities no complaints   Skin no complaints   Neuro dizziness   Endocrine no complaints   Psych no complaints   Past Medical/Surgical Hx:  Diabetes:   Hypothyroidism:   CAD:   hypokalemia:   Hypercholesterolemia:   Hypertension:   Cataract Extraction:   Stent - Cardiac:   Home Medications: Medication Instructions Last Modified Date/Time  ibuprofen 800 mg oral tablet 1 tab(s) orally 3 times a day with food 03-Dec-14 04:14  albuterol CFC free 90 mcg/inh aerosol 2 puff(s) inhaled 4 times a day, As Needed 03-Dec-14 04:14  aspirin 81 mg oral tablet   once a day  03-Dec-14 04:14  Zetia 10 mg oral tablet   once a day (at bedtime)  03-Dec-14 04:14  Toprol-XL 100 mg oral tablet, extended release   once a day  03-Dec-14 04:14  Synthroid 100 mcg (0.1 mg) oral tablet   once a day  03-Dec-14 04:14  spironolactone 25 mg oral tablet   once a day 03-Dec-14 04:14  Januvia 100 mg oral tablet 1  orally 3 times a day  03-Dec-14 04:14  clonazepam 1 mg oral tablet 1  orally 2 times a day  03-Dec-14 04:14  isosorbide mononitrate 60 mg oral tablet, extended release 1 tab(s) orally once a day (in the morning) 03-Dec-14 04:14   [facility] Current Meds:  Ondansetron injection, ( Zofran injection )  4 mg, IV push, q4h PRN for Nausea/Vomiting  Indication: Nausea/ Vomiting  Isosorbide Mononitrate SA tablet, ( Imdur)  60 mg Oral daily  - Indication: Angina  Instructions:  DO NOT CRUSH  Levothyroxine tablet, ( Synthroid)  0.1 mg Oral q6am  - Indication: Thyroid Hormone Replacement  ClonaZEPAM tablet, ( KlonoPIN)  1 mg Oral bid  - Indication: Seizures/ Anxiety  sitaGLIPtin tablet, ( Januvia)  100 mg Oral daily  - Indication: antidiabetic  agent  Aspirin Enteric Coated tablet, ( Ecotrin)  325 mg Oral daily  - Indication: Pain/Fever/Thromboembolic Disorders/Post MI/Prophylaxis MI  Instructions:  Initiate Bleeding Precautions Protocol--DO NOT CRUSH  Simvastatin tablet, ( Zocor)  20 mg Oral at bedtime  - Indication: Hypercholesterolemia  Insulin SS -Novolog injection, Subcutaneous, FSBS before meals and at bedtime  0 units if FSBS 0-150     2 unit(s) if FSBS 151 - 200     4 unit(s) if FSBS 201 - 250     6 unit(s) if FSBS 251 - 300     8 unit(s) if FSBS 301 - 350     10 unit(s) if FSBS 351 - 400  Call MD if FSBS is greater than 400, [Waste Code: Black]  Nursing Saline Flush, 3 to 6 ml, IV push, Q1M PRN for IV Maintenance  Pneumococcal 23-valent Vaccine, 0.5 ml, Intramuscular, once  Indication: Pneumococcal Immunization, 0.25m IM once (Stored in Pyxis Refrigerator)  hydrALAZINE HCl injection, ( Apresoline injection )  5 mg, IV push, q6h PRN for hypertension  Indication: Hypertension/ CHF, systolic greater than 1938 cefTRIAXone injection, ( Rocephin injection )  1 gram, IV Piggyback, q24h, Infuse over 30 minute(s)  Indication: Infection, Patient started on ceftriaxone on 12/3  Allergies:  Other- Explain in Comments Line: Hives  Actos: GI Distress  Norvasc: Other  Contrast dye: Other  Demerol: Other  Metformin: Other  Latex: Unknown  Vital Signs: **Vital Signs.:   03-Dec-14 13:00  Vital Signs Type Routine  Temperature Temperature (F) 97.9  Celsius 36.6  Temperature Source oral  Pulse Pulse 66  Respirations Respirations 20  Systolic BP Systolic BP 1101 Diastolic BP (mmHg) Diastolic BP (mmHg) 71  Mean BP 95  Pulse Ox % Pulse Ox % 93  Pulse Ox Activity Level  At rest  Oxygen Delivery Room Air/ 21 %    20:58  Mean BP 92   EXAM: GENERAL: Pleasant.  Fatigued.  NAD.  Normocephalic and atraumatic.  EYES: Funduscopic exam shows normal disc size, appearance and C/D ratio without clear evidence of  papilledema.  CARDIOVASCULAR: S1 and S2 sounds are within normal limits, without murmurs, gallops, or rubs.  MUSCULOSKELETAL: Bulk - Normal Tone - Normal Pronator Drift - Absent bilaterally. Ambulation - Deferred due to falls precautions.  R/L 5/5    Shoulder abduction (deltoid/supraspinatus, axillary/suprascapular n, C5) 5/5    Elbow flexion (biceps brachii, musculoskeletal n, C5-6) 5/5    Elbow extension (triceps, radial n, C7) 5/5    Finger adduction (interossei, ulnar n, T1)   5/5    Hip flexion (iliopsoas, L1/L2) 5/5    Knee flexion (hamstrings, sciatic n, L5/S1) 5/5    Knee extension (quadriceps, femoral n, L3/4) 5/5    Ankle dorsiflexion (tibialis anterior, deep fibular n, L4/5) 5/5    Ankle plantarflexion (gastroc, tibial n, S1)  NEUROLOGICAL: MENTAL STATUS: Patient is oriented to person, place and time.  Recent and remote memory are intact.  Attention span and concentration are intact.  Speech is slow but no aphasia.  Naming, repetition, comprehension and expressive speech are within normal limits.  Patient's fund of knowledge is within normal limits for educational level.  CRANIAL NERVES: Normal    CN II (normal visual acuity and visual fields) Normal    CN III, IV, VI (extraocular muscles are intact) Normal    CN V (facial sensation is intact bilaterally) Normal    CN VII (facial strength is intact bilaterally) Normal    CN VIII (hearing is intact bilaterally) Normal    CN IX/X (palate elevates midline, normal phonation) Normal    CN XI (shoulder shrug strength is normal and symmetric) Normal    CN XII (tongue protrudes midline)   SENSATION: Intact to pain and temp bilaterally (spinothalamic tracts) Intact to position and vibration bilaterally (dorsal columns)   REFLEXES: R/L 2+/2+    Biceps 2+/2+    Brachioradialis   2+/2+    Patellar 2+/2+    Achilles   COORDINATION/CEREBELLAR: Finger to nose testing is within normal limits.  Lab Results:   LabObservation:  03-Dec-14 09:10   OBSERVATION Reason for Test  Hepatic:  02-Dec-14 20:17   Bilirubin, Total 0.2  Alkaline Phosphatase 89 (45-117 NOTE: New Reference Range 09/30/13)  SGPT (ALT) 26  SGOT (AST)  13  Total Protein, Serum 7.0  Albumin, Serum 3.9  Routine Chem:  02-Dec-14 20:17   Glucose, Serum  186  BUN 15  Creatinine (comp) 1.10  Sodium, Serum 139  Potassium, Serum 4.5  Chloride, Serum 102  CO2, Serum 32  Calcium (Total), Serum 9.4  Osmolality (calc) 283  eGFR (African American)  59  eGFR (Non-African American)  51 (eGFR values <58m/min/1.73 m2 may be an indication of chronic kidney disease (CKD). Calculated eGFR is useful in patients with stable renal function. The eGFR calculation will not be reliable in acutely ill patients when serum creatinine is changing rapidly. It is not useful in  patients on dialysis. The eGFR calculation may not be applicable to patients at the low and high extremes of body sizes, pregnant women, and vegetarians.)  Anion Gap  5  Cardiac:  02-Dec-14 20:17   Troponin I < 0.02 (0.00-0.05 0.05 ng/mL or less: NEGATIVE  Repeat testing in 3-6 hrs  if clinically indicated. >0.05 ng/mL: POTENTIAL  MYOCARDIAL INJURY. Repeat  testing in 3-6 hrs if  clinically indicated. NOTE: An increase or decrease  of 30% or more on serial  testing suggests a  clinically important change)  Routine UA:  02-Dec-14 20:17   Color (UA) Amber  Clarity (UA) Cloudy  Glucose (UA) Negative  Bilirubin (UA) Negative  Ketones (UA) Trace  Specific Gravity (UA) 1.026  Blood (UA) Negative  pH (UA) 5.0  Protein (UA) 30 mg/dL  Nitrite (UA) Negative  Leukocyte Esterase (UA) 3+ (Result(s) reported on 11 Oct 2013 at 08:54PM.)  RBC (UA) 17 /HPF  WBC (UA) 155 /HPF  Bacteria (UA) NONE SEEN  Epithelial Cells (UA) 38 /HPF  Mucous (UA) PRESENT  Hyaline Cast (UA) 30 /LPF (Result(s) reported on 11 Oct 2013 at 08:54PM.)  Routine Hem:  02-Dec-14 20:17   WBC  (CBC) 7.4  RBC (CBC) 4.23  Hemoglobin (CBC) 12.7  Hematocrit (CBC) 37.7  Platelet Count (CBC) 190 (Result(s) reported on 11 Oct 2013 at 08:39PM.)  MCV 89  MCH 30.0  MCHC 33.6  RDW  13.7   Radiology Results: [place]:    03-Dec-14 15:39, US Carotid Doppler Bilateral  US Carotid Doppler Bilateral   REASON FOR EXAM:    cva  COMMENTS:       PROCEDURE: [place]  - US CAROTID DOPPLER BILATERAL  - Oct 12 2013  3:39PM     CLINICAL DATA:  Stroke    EXAM:  BILATERAL CAROTID DUPLEX ULTRASOUND    TECHNIQUE:  Pearline Cables scale imaging, color Doppler and duplex ultrasound were  performed of bilateral carotid and vertebral arteries in the neck.    COMPARISON:  None.  FINDINGS:  Criteria: Quantification of carotid stenosis is based on velocity  parameters that correlate the residual internal carotid diameter  with NASCET-based stenosis levels, using the diameter of the distal  internal carotid lumen as the denominator for stenosis measurement.    The following velocity measurements were obtained:    RIGHT    ICA:  103 cm/sec    CCA:  87 cm/sec    SYSTOLIC ICA/CCARATIO:  1.2  DIASTOLIC ICA/CCA RATIO:    ECA:  97 cm/sec    LEFT    ICA:  91 cm/sec    CCA:  99 cm/sec    SYSTOLIC ICA/CCA RATIO:  0.98    DIASTOLIC ICA/CCA RATIO:    ECA:  110 cm/sec  RIGHT CAROTID ARTERY: Moderate calcified plaque in the bulb. Low  resistance internal carotid Doppler pattern.    RIGHT VERTEBRAL ARTERY: Antegrade. Normal-appearing Doppler pattern.    LEFT CAROTID ARTERY: Moderate irregular calcified plaque. Low  resistance in the internal carotid artery Doppler waveform. Sharp  upstroke.    LEFT VERTEBRAL ARTERY:  Antegrade.  Normal Doppler pattern.     IMPRESSION:  Less than 50% stenosis in the right and left internal carotid  arteries.  Electronically Signed    By: Maryclare Bean M.D.    On: 10/12/2013 16:33         Verified By: Jamas Lav, M.D.,  CT:    02-Dec-14 23:20, CT Head Without Contrast   CT Head Without Contrast   REASON FOR EXAM:    fall/dizziness/confusion  COMMENTS:   LMP: Post-Menopausal    PROCEDURE: CT  - CT HEAD WITHOUT CONTRAST  - Oct 11 2013 11:20PM     CLINICAL DATA:  Fall, dizziness, confusion    EXAM:  CT HEAD WITHOUT CONTRAST    TECHNIQUE:  Contiguous axial images were obtained from the base of the skull  through the vertex without intravenous contrast.    COMPARISON:  None.  FINDINGS:  Mild age-related atrophy with chronic microvascular ischemic changes  are present. Encephalomalacia within the medial left occipital lobe  is most consistent with a remote left PCA territory infarct.    There is no acute intracranial hemorrhage or infarct. No mass lesion  or midline shift. Gray-white matter differentiation is well  maintained. Ventricles are normal in size without evidence of  hydrocephalus. CSF containing spaces are within normal limits. No  extra-axial fluid collection.    The calvarium is intact.    Orbital soft tissues are within normal limits.  The paranasal sinuses and mastoid aircells are well pneumatized and  free of fluid.    Scalp soft tissues are unremarkable.     IMPRESSION:  1. No acute intracranial process.  2. Remote left PCA territory infarct.  3. Mild age-related atrophy and chronic microvascular ischemic  disease.      Electronically Signed    By: Pincus Badder.D.  On: 10/11/2013 23:34     Verified By: Neomia Glass, M.D.,   Impression/Recommendations: Recommendations:   74 year old woman with a complex medical history outlined below presented to the ED with a few days of headaches and dizziness. with vague history of abrupt onset multiple abnormal symptoms.  Acute stroke is certainly ont he ddx.  HCT shows old PCA infarct.  Agree with workup including carotid doppler, TTE and Brain MRI.  Recommend physical therapy.  If found to have an acute stroke on Brain MRI on aspirin then would stop aspirin  and start plavix instead.   FOR STROKEContinue frequent neuro checks (Q4h) for first 24 hours and reassess frequency after that time.Brain MRI/A to confirm suspected CVA and to evaluate for other structural lesions or dissections.Carotid doppler - negative for significant carotid stenosisTTE with bubble study to evaluate for intracardiac shunts or cardiac thrombiMonitor on telemetry to eval for afibCheck fasting lipid panel, TSH and HgbA1cCycle cardiac enzymes Cardiac monitoring as described above.Antiplatelet therapy: as above, aspirin for now, change to plavix if seen to have had an acute stroke TREATMENT:Per ATP III guidlines, given that patient has additional stroke risk factors, start or increase statin if LDL > 70 PRESSURE CONTROL:Allow permissive HTN for BP less than 220/110Restart home antihypertensive medications prior to discharge. PT, OT, Speech evaluationsConsult speech therapy for swallowing assessment and stroke education.Strict NPO until cleared by speech therapy.Fall PrecautionsCounseling provided against smoking or tobacco useDVT prophylaxis with [place] lovenox.  have reviewed the results of the most recent imaging studies, tests and labs as outlined above and answered all related questions.  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 and coordinated plan of care with hospitalist.  Melrose Nakayama, MD  Electronic Signatures: Anabel Bene (MD)  (Signed 04-Dec-14 00:58)  Authored: REFERRING PHYSICIAN, Primary Care Physician, Consult, History of Present Illness, Review of Systems, PAST MEDICAL/SURGICAL HISTORY, HOME MEDICATIONS, Current Medications, ALLERGIES, NURSING VITAL SIGNS, Physical Exam-, LAB RESULTS, RADIOLOGY RESULTS, Recommendations   Last Updated: 04-Dec-14 00:58 by Anabel Bene (MD)

## 2015-03-02 NOTE — H&P (Signed)
PATIENT NAME:  Rhonda Park, Rhonda Park MR#:  [id number] DATE OF BIRTH:  1941/07/15  DATE OF ADMISSION:  10/12/2013  PRIMARY CARE PHYSICIAN:  Dr. Lacie Scotts.   REFERRING PHYSICIAN:  Dr. Manson Passey.   CHIEF COMPLAINT:  Headache, dizziness and memory problems for the past few weeks.  HISTORY OF PRESENT ILLNESS:  The patient is a 74 year old Caucasian female with past medical history of coronary artery disease status post stent, hypertension, diabetes mellitus, hypothyroidism and hyperlipidemia is presenting to the ER with a chief complaint of worsening of dizziness and headache for the past 3 or 4 days.  The patient is reporting that for the past three weeks she has been having memory problems associated with dizziness and headache.  She felt like those symptoms were resolved for a moment, but again for the past 3 to 4 days she has been experiencing all those symptoms including headache, dizziness, memory deficits and unsteady gait.  The patient is reporting that she is feeling dizzy and unstable when she is ambulating.  Denies any loss of consciousness.  She is having headache on the left side of the head, which is going from the frontal area to the occipital area.  Denies any blurry vision or double vision.  No similar complaints in the past.  Her speech is also quite sluggish and she is having a lot of difficulty in recalling phone numbers and the past events.  She is also reporting that she is feeling weak in her bilateral lower extremities and left upper extremity.  She did not notice any problems with swallowing, but she is not quite sure whether she is having any difficulty with swallowing.  Husband is at bedside.  The patient is reporting that her blood pressure has been running high for the past few days and primary care physician has increased the blood pressure medications recently.  In the ER, the patient's blood pressure is very high at 206/71, following which she has received clonidine 0.1 mg by mouth,  subsequently blood pressure dropped down to 153/86.  The patient's CAT scan of the head has revealed remote left-sided PCA territory infarct.  Hospitalist team is called to admit the patient for possible acute versus subacute stroke.   PAST MEDICAL HISTORY:  Coronary artery disease, hypertension, diabetes mellitus, hyperlipidemia, hypothyroidism.   PAST SURGICAL HISTORY:  Cholecystectomy, appendectomy, cyst removal from breast which was benign, coronary artery stent placement, hysterectomy, cataract repair.   ALLERGIES:  SHE IS ALLERGIC TO ACTOS, CONTRAST DYE, DEMEROL, METFORMIN, METOPROLOL, NORVASC.  HOME MEDICATIONS:  Januvia 100 mg once daily, Zetia 10 mg at bedtime, Toprol-XL 100 mg once daily, spironolactone 25 mg  once daily, isosorbide mononitrate 60 mg extended release once daily, aspirin 81 mg once daily, albuterol 2 puff inhalation 4 times a day as needed for shortness of breath, tramadol 50 mg by mouth q. 4 hours as needed for pain.   PSYCHOSOCIAL HISTORY:  Lives at home with a daughter, smokes 3 to 4 cigarettes per day.  Denies alcohol or illicit drug usage.   FAMILY HISTORY:  Father deceased with a stroke and he had history of hypertension.  Mom has a stroke history as well.   REVIEW OF SYSTEMS: CONSTITUTIONAL:  Denies any fever.  Complaining of fatigue.  EYES:  Denies blurry vision or double vision.  EARS, NOSE, THROAT:  Denies epistaxis, discharge.  RESPIRATION:  Denies cough, COPD.  CARDIOVASCULAR:  Denies chest pain, palpitations.  GASTROINTESTINAL:  Denies nausea, vomiting, diarrhea.  GENITOURINARY:  No dysuria or hematuria.  GYNECOLOGIC AND BREAST:  Denies breast mass, status post cyst removal, status post hysterectomy.  Denies any vaginal discharge.  ENDOCRINE:  Denies polyuria, nocturia.  Has chronic hypothyroidism and diabetes mellitus.   HEMATOLOGIC AND LYMPHATIC:  Denies anemia, easy bruising or bleeding.  INTEGUMENTARY:  No acne, rash, lesions.  MUSCULOSKELETAL:  No  joint pain in the neck and back.  Denies any shoulder pain.  NEUROLOGIC:  Denies any vertigo.  Complaining of memory problems and slurry speech.  PSYCHIATRIC:  No ADD, OCD.   PHYSICAL EXAMINATION: VITAL SIGNS:  Temperature 98.4, pulse 58, respirations 18, blood pressure is 181/61, pulse ox 93% on room air.  GENERAL APPEARANCE:  Not under acute distress.  Moderately built and nourished.  HEENT:  Normocephalic, atraumatic.  Pupils are equally reacting to light and accommodation.  No scleral icterus.  No conjunctival injection.  Extraocular movements are intact.  No sinus tenderness.  Moist mucous membranes.  No deviation of the angle of the mouth.  NECK:  Supple.  No JVD.  No thyromegaly.  Range of motion is intact.  LUNGS:  Clear to auscultation bilaterally.  No accessory muscle usage.  No anterior chest wall tenderness on palpation.  CARDIAC:  S1, S2 normal.  Regular rate and rhythm.  No murmurs. GASTROINTESTINAL:  Soft.  Bowel sounds are positive in all four quadrants.  Nontender, nondistended.  No hepatosplenomegaly.  NEUROLOGIC:  Awake, alert, oriented to time, place and person.  Cranial nerves II through XII are grossly intact.  Reflexes are 2+.  Motor, right upper extremity is 3 to 4 out of 5, bilateral lower extremities motor is 4 out of 5.  Sensory is intact in all four extremities.  Left upper extremity motor is 5 out of 5.  No cerebellar signs.  Finger-nose test is normal.  No pronator drift.  Reflexes are 2+.  SKIN:  Warm to touch.  Normal turgor.  No rashes.  No lesions.  MUSCULOSKELETAL:  No joint effusion, tenderness, erythema.  EXTREMITIES:  No edema, no cyanosis, no clubbing. PSYCHIATRIC:  Normal mood and affect.     LABORATORY AND IMAGING STUDIES:  A 12-lead EKG has revealed a normal sinus rhythm at 56 beats per minute, possible left atrial enlargement.  No acute ST-T wave changes.  CAT scan of the head without contrast has revealed no acute intracranial process.  Remote left PCA  territory infarct, mild age-related atrophy and chronic microvascular ischemic disease.  LFTs are normal.  Troponin less than 0.02.  WBC 7.4 and CBC is normal.  Urinalysis, leukocyte esterase 3+, nitrite negative, glucose negative, hyaline casts are present, mucus is present.  Chem-8, glucose is elevated at 186, GFR is at 59.  Anion gap 5.  The rest of the Chem-8 is normal.   ASSESSMENT AND PLAN:  A 74 year old Caucasian female presenting to the ER with a chief complaint of headache, dizziness and unsteady gait for the past few weeks, has been worse for the past 3 to 4 days, will be admitted with the following assessment and plan.  1.  Subacute versus acute left posterior cerebral artery cerebrovascular accident, which is ischemic in nature.  We will admit her to telemetry.  We will get stroke work-up with MRI of the brain, carotid Dopplers and 2-D echocardiogram.  We will provide her 325 mg of aspirin and statin.  Neurology consult is placed to Dr. Sherryll Burger who is on call.  Bedside swallow evaluation is pending.  Physical therapy consult is placed regarding weakness and unsteady gait.  2.  Acute cystitis.  Urine cultures are ordered.  IV Rocephin will be continued q. 24 hours.  3.  Coronary artery disease.  The patient will be on aspirin, statin and beta-blocker.  We will continue her home medication Imdur.  4.  Hypertension.  We will continue home medications.  We will allow permissive hypertension as patient seemed to be having acute versus subacute stroke.  5.  Diabetes mellitus.  Continue Januvia and sliding scale insulin.  6.  Hyperlipidemia.  The patient is on statin and check cholesterol panel.  7.  Hypothyroidism.  Continue Synthroid.  8.  We will provide gastrointestinal and deep vein thrombosis prophylaxis.  9.  CODE STATUS:  SHE IS FULL CODE.  Children are power of attorney.   Diagnosis and plan of care was discussed in detail with the patient and her ex-husband at bedside.  They both verbalized  understanding of the plan.    Total time spent on the admission is 50 minutes.    ____________________________ Ramonita LabAruna Mckynlee Luse, MD ag:ea D: 10/12/2013 03:38:48 ET T: 10/12/2013 04:10:02 ET JOB#: [id number]  cc: Ramonita LabAruna Gerrit Rafalski, MD, <Dictator> Meindert A. Lacie ScottsNiemeyer, MD Ramonita LabARUNA Sammy Cassar MD ELECTRONICALLY SIGNED 10/12/2013 7:30

## 2015-03-02 NOTE — Discharge Summary (Signed)
PATIENT NAME:  Rhonda Park, Rhonda Park MR#:  [id number] DATE OF BIRTH:  [birth year]  DATE OF ADMISSION:  10/12/2013 DATE OF DISCHARGE:  10/15/2013  ADMISSION DIAGNOSES: 1.  Headache. 2.  Dizziness. 3.  Memory problems for the past few weeks.   DISCHARGE DIAGNOSES: 1.  Acute stroke.  2.  Malignant hypertension.  3.  Hypothyroidism.  4.  Anxiety.  5.  Urinary tract infection.  6.  Diabetes type 2.  7. Right temporal hemianopsia.   DISPOSITION: Skilled nursing facility.   MEDICATIONS: Januvia 100 mg 3 times a day, isosorbide 60 mg once a day, Nexium 40 mg once a day, sertraline 100 mg 2 times a day, vitamin D 50,000 units once a day, aspirin 81 mg daily, Plavix 75 mg daily, simvastatin 20 mg daily, clonazepam 1 mg twice daily, ciprofloxacin 500 mg every 12 hours for 5 more days, levothyroxine 100 mcg once a day. Her metoprolol is being held to allow permissive hypertension and also because the patient at presentation was bradycardic. Her spironolactone has been held as well to allow permissive hypertension. These 2 medications could be started on within the next 2 weeks for which the patient is to follow up with her primary care physician to reinitiate these medications. In the meantime close blood pressure monitoring is recommended.   HOSPITAL COURSE: This is a very nice 74 year old female with history of coronary artery disease, hypertension, diabetes, hypothyroidism and hyperlipidemia. She had a stent in the past. The patient came with a chief complaint of dizziness and headache that was lasting for 3 to 4 days. The patient felt like the symptoms resolved, but came back on and off. She has been having significant unsteady gait and memory deficits. The patient was evaluated. Her blood pressure was 206/71 showing accelerated hypertension. She receive clonidine and then her blood pressure went down to 150s over 80s. CT scan showed a left side PCA infarct for which she was admitted and treated as acute  versus subacute CVA. Echo Doppler was done showing ejection fraction overall normal, 50% to 55% with elevation of the mean left atrial pressures, impaired relaxation pattern with left ventricular diastolic filling, moderate aortic regurgitation. There were no blood clots. The patient's telemetry did not show any significant arrhythmias. MRI of the brain showed an acute infarct suspected involving the medial left temporal lobe hippocampus, left aspect of that splenium of the corpus callosum and a tiny infarct in the left thalamus. Remote infarct in inferior aspect of the occipital lobe with hemorrhagic breakdown products, laminar necrosis and this an old infarct, no active bleeding. Please make sure to notice that this is not active. It is an old one. The patient was put on aspirin and Plavix. Neurology was consulted and they recommended to change aspirin to Plavix without the aspirin as dual antiplatelet therapy was not recommended. The patient overall is doing quite well and she is going to be discharged to a skilled nursing facility to improve her physical status. She actually walks long distance but she is unsteady, likely due to the hemianopsia and the location of her infarcts.   The patient is in good condition.  There was recommendation again by Dr. Sherryll Burger to do a TEE.  This could be done outpatient.  Get a bubble study as well and continue monitoring. Likely she will need Holter monitoring as well.   I spent about 45 minutes with this discharge.     ____________________________ Felipa Furnace, MD rsg:dp D: 10/15/2013 11:58:41 ET T: 10/15/2013  12:40:55 ET JOB#: [id number]  cc: Felipa Furnaceoberto Sanchez Gutierrez, MD, <Dictator> Dr. Blondell RevealShah [facility] [place]  Esco Joslyn Juanda ChanceSANCHEZ GUTIERRE MD ELECTRONICALLY SIGNED 10/20/2013 18:09

## 2015-06-04 ENCOUNTER — Telehealth: Payer: Self-pay | Admitting: *Deleted

## 2015-06-04 NOTE — Telephone Encounter (Signed)
Forward to Ryan Dunn 

## 2015-06-04 NOTE — Telephone Encounter (Signed)
Pt is ready to rsch the stress test that she had to cancel last year... Please call pt with a new time Any day is fine  Can only be in the afternoon, please advise.

## 2015-06-05 NOTE — Telephone Encounter (Signed)
No, we do need to see her first. Please schedule her around my clinic since she prefers PM.

## 2015-06-05 NOTE — Telephone Encounter (Signed)
Attempted to contact pt. Son answered, poor connection. Unable to hear pt son. Will call back

## 2015-06-06 NOTE — Telephone Encounter (Signed)
Lm with son and he knows we are trying to schedule pt to come in and see [place]  He said he will relay the message to her

## 2015-06-06 NOTE — Telephone Encounter (Signed)
Left detailed message regarding Ryan's recommendations on home/cell number with number to CB with questions. Will ask scheduling to call pt for appt with Alycia Rossetti

## 2015-07-17 ENCOUNTER — Encounter: Payer: Self-pay | Admitting: Cardiovascular Disease

## 2015-07-17 ENCOUNTER — Ambulatory Visit (INDEPENDENT_AMBULATORY_CARE_PROVIDER_SITE_OTHER): Payer: Medicare Other | Admitting: Cardiovascular Disease

## 2015-07-17 VITALS — BP 126/62 | HR 59 | Ht 66.0 in | Wt 178.5 lb

## 2015-07-17 DIAGNOSIS — I25119 Atherosclerotic heart disease of native coronary artery with unspecified angina pectoris: Secondary | ICD-10-CM | POA: Diagnosis not present

## 2015-07-17 DIAGNOSIS — E785 Hyperlipidemia, unspecified: Secondary | ICD-10-CM

## 2015-07-17 DIAGNOSIS — I10 Essential (primary) hypertension: Secondary | ICD-10-CM | POA: Diagnosis not present

## 2015-07-17 NOTE — Patient Instructions (Signed)
Medication Instructions: Continue same medications.   Labwork: None.   Procedures/Testing: None.   Follow-Up: 6 months with Dr. Pearlee Arvizu.   Any Additional Special Instructions Will Be Listed Below (If Applicable).   

## 2015-07-17 NOTE — Progress Notes (Signed)
HPI  This is a 74 year old female who is here today for a followup visit. She has known history of coronary artery disease with previous stenting of the right coronary artery. Most recent cardiac catheterization in 2010 showed moderate ostial left main stenosis with 90% distal LAD stenosis close to the apex and mild instent restenosis in the right coronary artery. She has known history of refractory hypertension with previous evidence of hyperaldosteronism with severe hypokalemia. Imaging was suggestive of adrenal adenoma. She has been treated successfully with spironolactone. She presented in 10/2013 to [facility] with headache and hypertensive urgency. 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 Echocardiogram showed normal LV systolic function with ? moderate aortic insufficiency. Carotid duplex showed mild less than 50% disease bilaterally.  She had chest pain in December and was supposed to get a stress test. She reports no further episodes of chest discomfort. She complains of intermittent memory problems but is feeling better now.   Allergies  Allergen Reactions  . Codeine     Reports that she gets jittery and does not feel right able to tolerate hydrocodone  . Demerol [Meperidine]   . Ivp Dye [Iodinated Diagnostic Agents]   . Shellfish Allergy   . Statins     myalgia     Current Outpatient Prescriptions on File Prior to Visit  Medication Sig Dispense Refill  . amLODipine (NORVASC) 10 MG tablet Take 10 mg by mouth daily.    . Cholecalciferol (VITAMIN D-3) 5000 UNITS TABS Take by mouth daily.    . clonazePAM (KLONOPIN) 1 MG tablet Take 1 mg by mouth 2 (two) times daily.    . clopidogrel (PLAVIX) 75 MG tablet Take 1 tablet (75 mg total) by mouth daily with breakfast. 90 tablet 3  . esomeprazole (NEXIUM) 40 MG capsule Take 40 mg by mouth daily before breakfast.    . ezetimibe (ZETIA) 10 MG tablet Take 10 mg by mouth daily.    . folic acid (FOLVITE) 400 MCG tablet  Take 400 mcg by mouth daily.    . isosorbide mononitrate (IMDUR) 60 MG 24 hr tablet Take 1.5 tablets (90 mg total) by mouth daily. 90 tablet 6  . levothyroxine (SYNTHROID, LEVOTHROID) 100 MCG tablet Take 100 mcg by mouth daily before breakfast.    . metoprolol succinate (TOPROL-XL) 100 MG 24 hr tablet Take 50 mg by mouth daily. Take with or immediately following a meal.    . sertraline (ZOLOFT) 100 MG tablet Take 100 mg by mouth daily.    Marland Kitchen spironolactone (ALDACTONE) 25 MG tablet Take 25 mg by mouth 2 (two) times daily.      No current facility-administered medications on file prior to visit.     Past Medical History  Diagnosis Date  . Cardiomegaly   . Cervicalgia   . Chronic airway obstruction, not elsewhere classified   . Unspecified congenital cystic kidney disease   . Type II or unspecified type diabetes mellitus without mention of complication, not stated as uncontrolled   . Unspecified hypothyroidism   . Unspecified essential hypertension     With evidence of hyperaldosteronism with severe hypokalemia. 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  Marland Kitchen Hyperlipidemia   . Stroke   . Coronary artery disease     Remote RCA stent. Most recent cardiac catheterization in May of 2010 showed an ejection fraction of 65% with 50% ostial left main stenosis and 90% distal LAD stenosis close to the apex  . Coronary atherosclerosis of unspecified type of  vessel, native or graft   . Shortness of breath     with exertion  . Anxiety   . GERD (gastroesophageal reflux disease)   . Headache(784.0)   . Arthritis   . Anemia      Past Surgical History  Procedure Laterality Date  . Tonsillectomy    . Hysterectomy    . Cholecystectomy    . Back surgery    . Appendectomy    . Knee surgery    . Posterior segment      unlisted procedure  . Cardiac catheterization  03/2009    [facility]  . Cardiac catheterization  05/2007    [facility]  . Cardiac catheterization  05/2009    ''  . Cardiac  catheterization  07/2005    ''  . Abdominal hysterectomy    . Anterior cervical decomp/discectomy fusion N/A 05/23/2014    Procedure: CERVICAL FIVE SIX ANTERIOR CERVICAL DECOMPRESSION/DISCECTOMY FUSION 1 LEVEL;  Surgeon: Temple Pacini, MD;  Location: [facility];  Service: Neurosurgery;  Laterality: N/A;     Family History  Problem Relation Age of Onset  . Heart attack Mother      Social History   Social History  . Marital Status: Single    Spouse Name: N/A  . Number of Children: N/A  . Years of Education: N/A   Occupational History  . Not on file.   Social History Main Topics  . Smoking status: Current Every Day Smoker -- 0.25 packs/day for 20 years    Types: Cigarettes  . Smokeless tobacco: Not on file  . Alcohol Use: No  . Drug Use: No  . Sexual Activity: Not on file   Other Topics Concern  . Not on file   Social History Narrative     ROS A 10 point review of system was performed. It is negative other than that mentioned in the history of present illness.   PHYSICAL EXAM   BP 126/62 mmHg  Pulse 59  Ht 5\' 6"  (1.676 m)  Wt 178 lb 8 oz (80.967 kg)  BMI 28.82 kg/m2 Constitutional: She is oriented to person, place, and time. She appears well-developed and well-nourished. No distress.  HENT: No nasal discharge.  Head: Normocephalic and atraumatic.  Eyes: Pupils are equal and round. No discharge.  Neck: Normal range of motion. Neck supple. No JVD present. No thyromegaly present.  Cardiovascular: Normal rate, regular rhythm, normal heart sounds. Exam reveals no gallop and no friction rub. No murmur heard.  Pulmonary/Chest: Effort normal and breath sounds normal. No stridor. No respiratory distress. She has no wheezes. She has no rales. She exhibits no tenderness.  Abdominal: Soft. Bowel sounds are normal. She exhibits no distension. There is no tenderness. There is no rebound and no guarding.  Musculoskeletal: Normal range of motion. She exhibits no edema and no  tenderness.  Neurological: She is alert and oriented to person, place, and time. Coordination normal.  Skin: Skin is warm and dry. No rash noted. She is not diaphoretic. No erythema. No pallor.  Psychiatric: She has a normal mood and affect. Her behavior is normal. Judgment and thought content normal.     EKG: Sinus  Bradycardia  -Nonspecific ST depression  -Nondiagnostic.   ABNORMAL    ASSESSMENT AND PLAN

## 2015-07-17 NOTE — Assessment & Plan Note (Signed)
She is doing reasonably well and currently denies any chest discomfort. I recommend continuing medical therapy.

## 2015-07-17 NOTE — Assessment & Plan Note (Signed)
Blood pressure is well controlled on current medications. I made no changes today.

## 2015-07-17 NOTE — Assessment & Plan Note (Signed)
She is intolerant to statins and currently is on Zetia 10 mg daily.

## 2015-08-06 ENCOUNTER — Other Ambulatory Visit: Payer: Self-pay | Admitting: Cardiovascular Disease

## 2015-10-25 ENCOUNTER — Telehealth: Payer: Self-pay

## 2015-10-25 DIAGNOSIS — M5126 Other intervertebral disc displacement, lumbar region: Secondary | ICD-10-CM | POA: Insufficient documentation

## 2015-10-25 NOTE — Telephone Encounter (Signed)
Received clearance request from [facility] Pt scheduled for L/4-5 Lumbar Laminectomy on 11/16/15 Placed in MD box to review

## 2015-10-30 ENCOUNTER — Telehealth: Payer: Self-pay

## 2015-10-30 NOTE — Telephone Encounter (Signed)
S/w pt husband, Rhonda Park, who states pt is home asleep and he is the one taking care of her. Informed him that  per Dr. Kirke CorinArida, hold plavix 7 days before surgery. Spouse verbalized understanding. Clearance faxed to [facility], [phone]

## 2015-10-30 NOTE — Telephone Encounter (Signed)
Faxed clearance to [facility] to [phone]

## 2015-11-02 ENCOUNTER — Other Ambulatory Visit (HOSPITAL_COMMUNITY): Payer: Self-pay | Admitting: Neurosurgery

## 2015-11-07 ENCOUNTER — Inpatient Hospital Stay (HOSPITAL_COMMUNITY)
Admission: RE | Admit: 2015-11-07 | Discharge: 2015-11-07 | Disposition: A | Discharge status: Home / Self Care | Payer: Self-pay | Source: Ambulatory Visit

## 2015-11-08 ENCOUNTER — Other Ambulatory Visit: Payer: Self-pay

## 2015-11-08 ENCOUNTER — Encounter (HOSPITAL_COMMUNITY): Payer: Self-pay | Admitting: Vascular Surgery

## 2015-11-08 ENCOUNTER — Encounter (HOSPITAL_COMMUNITY)
Admission: RE | Admit: 2015-11-08 | Discharge: 2015-11-08 | Disposition: A | Discharge status: Home / Self Care | Payer: Medicare Other | Source: Ambulatory Visit | Attending: Neurosurgery | Admitting: Neurosurgery

## 2015-11-08 ENCOUNTER — Encounter (HOSPITAL_COMMUNITY): Payer: Self-pay

## 2015-11-08 DIAGNOSIS — J449 Chronic obstructive pulmonary disease, unspecified: Secondary | ICD-10-CM | POA: Diagnosis not present

## 2015-11-08 DIAGNOSIS — I429 Cardiomyopathy, unspecified: Secondary | ICD-10-CM | POA: Insufficient documentation

## 2015-11-08 DIAGNOSIS — E039 Hypothyroidism, unspecified: Secondary | ICD-10-CM | POA: Diagnosis not present

## 2015-11-08 DIAGNOSIS — Q619 Cystic kidney disease, unspecified: Secondary | ICD-10-CM | POA: Diagnosis not present

## 2015-11-08 DIAGNOSIS — Z01812 Encounter for preprocedural laboratory examination: Secondary | ICD-10-CM | POA: Insufficient documentation

## 2015-11-08 DIAGNOSIS — M5126 Other intervertebral disc displacement, lumbar region: Secondary | ICD-10-CM | POA: Insufficient documentation

## 2015-11-08 DIAGNOSIS — K5190 Ulcerative colitis, unspecified, without complications: Secondary | ICD-10-CM | POA: Insufficient documentation

## 2015-11-08 DIAGNOSIS — I2510 Atherosclerotic heart disease of native coronary artery without angina pectoris: Secondary | ICD-10-CM | POA: Insufficient documentation

## 2015-11-08 DIAGNOSIS — K219 Gastro-esophageal reflux disease without esophagitis: Secondary | ICD-10-CM | POA: Insufficient documentation

## 2015-11-08 DIAGNOSIS — R0789 Other chest pain: Secondary | ICD-10-CM | POA: Diagnosis not present

## 2015-11-08 DIAGNOSIS — Z8673 Personal history of transient ischemic attack (TIA), and cerebral infarction without residual deficits: Secondary | ICD-10-CM | POA: Insufficient documentation

## 2015-11-08 DIAGNOSIS — I10 Essential (primary) hypertension: Secondary | ICD-10-CM | POA: Insufficient documentation

## 2015-11-08 DIAGNOSIS — F419 Anxiety disorder, unspecified: Secondary | ICD-10-CM | POA: Insufficient documentation

## 2015-11-08 DIAGNOSIS — Z955 Presence of coronary angioplasty implant and graft: Secondary | ICD-10-CM | POA: Insufficient documentation

## 2015-11-08 DIAGNOSIS — E119 Type 2 diabetes mellitus without complications: Secondary | ICD-10-CM | POA: Diagnosis not present

## 2015-11-08 DIAGNOSIS — Z01818 Encounter for other preprocedural examination: Secondary | ICD-10-CM | POA: Diagnosis present

## 2015-11-08 HISTORY — DX: Personal history of other medical treatment: Z92.89

## 2015-11-08 HISTORY — DX: Ulcerative colitis, unspecified, without complications: K51.90

## 2015-11-08 LAB — BASIC METABOLIC PANEL
Anion gap: 11 (ref 5–15)
BUN: 16 mg/dL (ref 6–20)
CHLORIDE: 102 mmol/L (ref 101–111)
CO2: 26 mmol/L (ref 22–32)
CREATININE: 1.11 mg/dL — AB (ref 0.44–1.00)
Calcium: 9.2 mg/dL (ref 8.9–10.3)
GFR calc Af Amer: 56 mL/min — ABNORMAL LOW (ref >60)
GFR, EST NON AFRICAN AMERICAN: 48 mL/min — AB (ref >60)
GLUCOSE: 118 mg/dL — AB (ref 65–99)
Potassium: 4.8 mmol/L (ref 3.5–5.1)
SODIUM: 139 mmol/L (ref 135–145)

## 2015-11-08 LAB — CBC WITH DIFFERENTIAL/PLATELET
Basophils Absolute: 0 10*3/uL (ref 0.0–0.1)
Basophils Relative: 0 %
EOS ABS: 0.1 10*3/uL (ref 0.0–0.7)
EOS PCT: 1 %
HCT: 36.6 % (ref 36.0–46.0)
Hemoglobin: 12.2 g/dL (ref 12.0–15.0)
LYMPHS ABS: 1.1 10*3/uL (ref 0.7–4.0)
Lymphocytes Relative: 18 %
MCH: 30.8 pg (ref 26.0–34.0)
MCHC: 33.3 g/dL (ref 30.0–36.0)
MCV: 92.4 fL (ref 78.0–100.0)
MONO ABS: 0.3 10*3/uL (ref 0.1–1.0)
Monocytes Relative: 5 %
Neutro Abs: 4.8 10*3/uL (ref 1.7–7.7)
Neutrophils Relative %: 76 %
PLATELETS: 114 10*3/uL — AB (ref 150–400)
RBC: 3.96 MIL/uL (ref 3.87–5.11)
RDW: 14.2 % (ref 11.5–15.5)
WBC: 6.3 10*3/uL (ref 4.0–10.5)

## 2015-11-08 LAB — SURGICAL PCR SCREEN
MRSA, PCR: NEGATIVE
STAPHYLOCOCCUS AUREUS: NEGATIVE

## 2015-11-08 LAB — GLUCOSE, CAPILLARY: GLUCOSE-CAPILLARY: 126 mg/dL — AB (ref 65–99)

## 2015-11-08 NOTE — Progress Notes (Signed)
Rhonda Park reports having nagging left sided chest pain, "not so much pain as spasm" Patient states that this pain has been off and on for a couple of weeks.  Rhonda Park reports that she experienced it since she has been here today for her PAT appointment.  Patient denies lightheadedness , shortness of breath or nausea or vomiting, "It is probable muscular."   Allison Z.. Pa, notified of patientnt's complaint 

## 2015-11-08 NOTE — Pre-Procedure Instructions (Signed)
    Rhonda SevereBrigitte D Park  11/08/2015    Your procedure is scheduled on Tuesday, January 3.  [facility]30 A.M.                 Your surgery or procedure is scheduled for 9:30 am.   Call this number if you have problems the morning of surgery:[phone]   Remember:  Do not eat food or drink liquids after midnight Monday, January 2.  Take these medicines the morning of surgery with A SIP OF WATER: Amlodipine, Clonazepman, Esomeprazole (Nexium), Isosorbide Mononitrate (Imdur), Levothyroxine (Synthyroid), Metoprolol (Toprol- XL), Sertraline (Zoloft).              May take Hydrocodone- Acetaminophen if needed.                  Stop taking Plavix 7 days prior to surgery as instructed by Dr Kirke CorinArida.  Do not wear jewelry, make-up or nail polish.   Do not wear lotions, powders, or perfumes.    Do not shave 48 hours prior to surgery.   Do not bring valuables to the hospital.  [facility] is [facility] responsible for any belongings or valuables.  Contacts, dentures or bridgework may not be worn into surgery.  Leave your suitcase in the car.  After surgery it may be brought to your room.  For patients admitted to the hospital, discharge time will be determined by your treatment team.  Patients discharged the day of surgery will not be allowed to drive home.   Name and phone number of your driver:  - Special instructions:  Review  [facility] - Preparing For Surgery.  Please read over the following fact sheets that you were given. Pain Booklet, Coughing and Deep Breathing and Surgical Site Infection Prevention

## 2015-11-08 NOTE — Progress Notes (Signed)
Anesthesia PAT Evaluation: Patient is a 74 year old female scheduled for right L4-5 microdiscectomy on 11/16/15 by Dr. Carin PrimrosePool-->update notified after I saw patient that case is being moved to 11/13/15.  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 PCP is Dr. Lacie ScottsNiemeyer. Cardiologist is Dr. Kirke CorinArida who is on vacation this week.   PAT Vitals: BP 121/50, HR 62, RR 20, T 36.8C, O2 sat 97%, CBG 126.  Patient is sitting in a wheelchair. No acute distress. No conversational dyspnea. Heart RRR, no murmur noted. Lungs clear. No ankle edema noted.  Meds include amlodipine, Klonopin, Plavix, Nexium, Zetia,  Imdur, Levothyroxine, Toprol-XL, Zoloft, Aldactone.  11/08/15 EKG: SB at 59 bpm, non-specific ST/T wave abnormality.  She was scheduled for a nuclear stress test in 10/2014 due to chest pain, but she never had it. At her last follow-up appointment with Dr. Kirke CorinArida on 07/17/15 she denied recurrent chest pain so he did not pursue her having the test. In fact, he has cleared her for upcoming surgery. However, today she reported that for approximately two weeks she has been having intermittent bursts of left chest pain. She describes it as an intermittent tapping that lasts just a second but then will occur repeatedly for hours at a time. It is very localized, non-radiating, 2/10. Pains occur at least every day. There is no associated SOB, palpitations, syncope, nausea, diaphoresis. She thought symptoms may have started after taking a new pain medication (name ?) or after lifting her heavy crock pot. Stress seems to bring on the symptoms, and they eventually just go away on their own. It is nothing like her chest pain when she had her stent placed several years ago. There is no point tenderness. She has  not been very active for several months now due to severe back and RLE pain.   10/12/13 Echo: LVEF by visual estimate 50-55%, elevated LA pressure, impaired relaxation pattern of LV diastolic filling, moderate AR, severely increased LV posterior wall thickness.   03/29/09 Cardiac cath (scanned under Media tab) showed: 50% left main, 20% LAD, 90% distal LAD, 80% D1, 40% RAMUS INT, 30% RCA. LVEF 65%. Impression: LM stenosis is slightly worse since last cath but still close to 50%, mild in stent restenosis in RCA, the distal LAD lesion is close to the apex.   12/02/13 Abdominal duplex: Normal caliber abdominal aorta, common and external iliac arteries.   10/12/13 Carotid duplex: < 50% bilateral ICA stenosis.  Preoperative labs noted.   Patient with known CAD and moderate AR, who presents with atypical chest pains for the past few weeks. Overall, I think her EKG appears stable. Last cath was > 5 years ago. Unfortunately, she can not exert herself much due to severe back and RLE pains. Discussed with anesthesiologist Dr. Maple HudsonMoser and cardiology CARD master Trish. With new chest symptoms she will need re-evaluation by cardiology prior to proceeding with surgery. Will defer decision of additional testing to cardiology. Patient to be seen by Ronie Spiesayna Dunn, PA-C tomorrow at 11:30 AM. Patient aware, and Erie NoeVanessa at Dr. Lindalou HosePool's office notified. In the interim, I did discuss symptoms that would warrant contacting EMS for emergent evaluation. (Of note, patient seen today as well by CSW due her home environment/relationship with her ex-husband.)  Velna Ochsllison Pantera Winterrowd, PA-C [facility] Stay [facility]/Anesthesiology Phone [phone] 11/08/2015 4:54 PM

## 2015-11-08 NOTE — Progress Notes (Signed)
Rhonda Park reports having nagging left sided chest pain, "not so much pain as spasm" Patient states that this pain has been off and on for a couple of weeks.  Rhonda Park reports that she experienced it since she has been here today for her PAT appointment.  Patient denies lightheadedness , shortness of breath or nausea or vomiting, "It is probable muscular."   Edmonia CaprioAllison Z.. Pa, notified of patientnt's complaint

## 2015-11-08 NOTE — Pre-Procedure Instructions (Addendum)
    Rhonda SevereBrigitte D Eidem  11/08/2015    Your procedure is scheduled on Friday, January 6.   [facility] at 12:30 P.M.              Your surgery or procedure is scheduled for  2:30 PM   Call this number if you have problems the morning of surgery: [phone]               For any other questions, please call [phone], Monday - Friday 8 AM - 4 PM.   Remember:  Do not eat food or drink liquids after midnight Thursday, January 5.  Take these medicines the morning of surgery with A SIP OF WATER : Amlodipine,  Clonazepam,  esomeprazole (NEXIUM), isosorbide mononitrate (IMDUR), levothyroxine (SYNTHROID, LEVOTHROID), metoprolol succinate (TOPROL-XL), sertraline (ZOLOFT).             May take Hydrocodone- Acetaminophen if needed.              Stop taking Plavix 7 days prior to surgery as instructed by Dr Kirke CorinArida   Do not wear jewelry, make-up or nail polish.  Do not wear lotions, powders, or perfumes.   Do not shave 48 hours prior to surgery.    Do not bring valuables to the hospital.  [facility] is not responsible for any belongings or valuables.  Contacts, dentures or bridgework may not be worn into surgery.  Leave your suitcase in the car.  After surgery it may be brought to your room.  For patients admitted to the hospital, discharge time will be determined by your treatment team.  Patients discharged the day of surgery will not be allowed to drive home.   Name and phone number of your driver:   -  Special instructions:  Review  [facility] - Preparing For Surgery.  Please read over the following fact sheets that you were given. Pain Booklet, Coughing and Deep Breathing and Surgical Site Infection Prevention

## 2015-11-09 ENCOUNTER — Ambulatory Visit (INDEPENDENT_AMBULATORY_CARE_PROVIDER_SITE_OTHER): Payer: Medicare Other | Admitting: Physician Assistant

## 2015-11-09 ENCOUNTER — Encounter: Payer: Self-pay | Admitting: Physician Assistant

## 2015-11-09 VITALS — BP 110/60 | HR 64 | Ht 66.0 in | Wt 184.0 lb

## 2015-11-09 DIAGNOSIS — I10 Essential (primary) hypertension: Secondary | ICD-10-CM | POA: Diagnosis not present

## 2015-11-09 DIAGNOSIS — M5441 Lumbago with sciatica, right side: Secondary | ICD-10-CM

## 2015-11-09 DIAGNOSIS — I25119 Atherosclerotic heart disease of native coronary artery with unspecified angina pectoris: Secondary | ICD-10-CM | POA: Diagnosis not present

## 2015-11-09 DIAGNOSIS — R079 Chest pain, unspecified: Secondary | ICD-10-CM | POA: Diagnosis not present

## 2015-11-09 LAB — HEMOGLOBIN A1C
Hgb A1c MFr Bld: 5.8 % — ABNORMAL HIGH (ref 4.8–5.6)
Mean Plasma Glucose: 120 mg/dL

## 2015-11-09 NOTE — Progress Notes (Addendum)
Cardiology Office Note   Date:  11/09/2015   ID:  Rhonda Park 01/10/1941, MRN [id number]  PCP:  Evelene Croon, MD  Cardiologist:  Dr. Kirke Corin   Chief Complaint  Patient presents with  . Pre-op Exam    seen for Dr. Kirke Corin  . Chest Pain      History of Present Illness: Rhonda Park is a 74 y.o. female who presents for preop evaluation. Her PMH include HTN, CAD, hypothyroidism, prediabetes and HLD intolerant to statin (on Zetia). She has known CAD with previous stenting of RCA. Last cardiac cath in 2010 showed moderate 50% ost LM stenosis, 90% distal LAD stenosis close to apex, mild ISR in RCA. She has no history of refractory hypertension with previous evidence of hyperaldosteronism with severe hypokalemia. Image suggestive of adrenal adenoma. She was treated successfully with spironolactone. She had an episode of CVA after presented to [facility] with headache and hypertensive emergency in December 2014. Carotid ultrasound showed less than 50% disease bilaterally. Her last echo 10/12/2013 EF 50-55%, moderate AR, severely increased posterior ventricle thickness. She reported some chest discomfort in December 2015 and was supposed to get a stress test however this was never done. According to the patient, she had a "chest cold" (likely wheezing) and only got half of the stress test done. Her functional status is severely limited back her bad back and pain radiating from back down to right leg. She can only walk a little with a walker.   In the last 2 month, she has had 2 episodes of chest pain. The first time, she was lifting a bowl of potato salad during Christmas. The 2nd time was a month ago when she took some pain medication. She says her PCP gave her 3 type of medication to control her back pain, she took all of them at once and had chest pain shortly after which lasted all night. It eventually went away by the following day. Since then she has been experience pin and needle  sensation in the chest multiple times a day which only last a few seconds each. She was seen by anesthesiology prior to her upcoming right L4-L5 microdiscectomy and told them about chest pain, she was referred back to cardiology for preop risk stratification.       Past Medical History  Diagnosis Date  . Cardiomegaly   . Cervicalgia   . Chronic airway obstruction, not elsewhere classified   . Unspecified congenital cystic kidney disease   . Unspecified hypothyroidism   . Unspecified essential hypertension     With evidence of hyperaldosteronism with severe hypokalemia. 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  Marland Kitchen Hyperlipidemia   . Coronary artery disease     Remote RCA stent. Most recent cardiac catheterization in May of 2010 showed an ejection fraction of 65% with 50% ostial left main stenosis and 90% distal LAD stenosis close to the apex  . Coronary atherosclerosis of unspecified type of vessel, native or graft   . Shortness of breath     with exertion  . Anxiety   . GERD (gastroesophageal reflux disease)   . Headache(784.0)   . Arthritis   . Anemia   . Type II or unspecified type diabetes mellitus without mention of complication, not stated as uncontrolled     Not on medications  . Stroke [facility]) 10/2013    memory loss  . UC (ulcerative colitis) (HCC)   . History of blood transfusion     30 units child  birth    Past Surgical History  Procedure Laterality Date  . Tonsillectomy    . Hysterectomy    . Cholecystectomy    . Back surgery    . Appendectomy    . Knee surgery Right     "growth"  . Posterior segment      unlisted procedure  . Cardiac catheterization  03/2009    [facility]  . Cardiac catheterization  05/2007    [facility]  . Cardiac catheterization  05/2009    ''  . Cardiac catheterization  07/2005    ''  . Abdominal hysterectomy    . Anterior cervical decomp/discectomy fusion N/A 05/23/2014    Procedure: CERVICAL FIVE SIX ANTERIOR CERVICAL DECOMPRESSION/DISCECTOMY  FUSION 1 LEVEL;  Surgeon: Temple Pacini, MD;  Location: [facility];  Service: Neurosurgery;  Laterality: N/A;  . Breast cyst excision Left     3     Current Outpatient Prescriptions  Medication Sig Dispense Refill  . amLODipine (NORVASC) 10 MG tablet Take 10 mg by mouth daily.    . Cholecalciferol (VITAMIN D-3) 5000 UNITS TABS Take 5,000 Units by mouth daily.     . clonazePAM (KLONOPIN) 1 MG tablet Take 1 mg by mouth 2 (two) times daily.    . clopidogrel (PLAVIX) 75 MG tablet Take 1 tablet (75 mg total) by mouth daily with breakfast. (Patient taking differently: Take 75 mg by mouth daily with breakfast. HOLDING FOR PENDING SURGERY 11/09/15) 90 tablet 3  . esomeprazole (NEXIUM) 40 MG capsule Take 40 mg by mouth daily before breakfast.    . ezetimibe (ZETIA) 10 MG tablet Take 10 mg by mouth daily.    . isosorbide mononitrate (IMDUR) 60 MG 24 hr tablet Take 1.5 tablets (90 mg total) by mouth daily. 90 tablet 6  . levothyroxine (SYNTHROID, LEVOTHROID) 100 MCG tablet Take 100 mcg by mouth daily before breakfast.    . metoprolol succinate (TOPROL-XL) 100 MG 24 hr tablet Take 50 mg by mouth daily. Take with or immediately following a meal.    . sertraline (ZOLOFT) 100 MG tablet Take 100 mg by mouth daily.    Marland Kitchen spironolactone (ALDACTONE) 25 MG tablet Take 25 mg by mouth 2 (two) times daily.      No current facility-administered medications for this visit.    Allergies:   Demerol; Codeine; Ivp dye; Shellfish allergy; and Statins    Social History:  The patient  reports that she has been smoking Cigarettes.  She has been smoking about 0.02 packs per day. She does not have any smokeless tobacco history on file. She reports that she does not drink alcohol or use illicit drugs.   Family History:  The patient's family history includes Diabetes in her brother, maternal grandfather, maternal grandmother, mother, and sister; Heart attack in her mother; Hypertension in her brother, father, mother, sister,  and son; Stroke in her mother and son.    ROS:  Please see the history of present illness.   Otherwise, review of systems are positive for chest pain, back pain, right leg pain.   All other systems are reviewed and negative.    PHYSICAL EXAM: VS:  BP 110/60 mmHg  Pulse 64  Ht  (1.676 m)  Wt 184 lb (83.462 kg)  BMI 29.71 kg/m2 , BMI Body mass index is 29.71 kg/(m^2). GEN: Well nourished, well developed, in no acute distress HEENT: normal Neck: no JVD, carotid bruits, or masses Cardiac: RRR; no murmurs, rubs, or gallops,no edema  Respiratory:  clear to auscultation bilaterally, normal work of breathing GI: soft, nontender, nondistended, + BS MS: no deformity or atrophy Skin: warm and dry, no rash Neuro:  Strength and sensation are intact Psych: euthymic mood, full affect   EKG:  EKG is ordered today. The ekg ordered today demonstrates NSR without significant ST-T wave changes   Recent Labs: 11/08/2015: BUN 16; Creatinine, Ser 1.11*; Hemoglobin 12.2; Platelets 114*; Potassium 4.8; Sodium 139    Lipid Panel    Component Value Date/Time   CHOL 206* 10/13/2013 0624   TRIG 180 10/13/2013 0624   HDL 46 10/13/2013 0624   VLDL 36 10/13/2013 0624   LDLCALC 124* 10/13/2013 0624      Wt Readings from Last 3 Encounters:  11/09/15 184 lb (83.462 kg)  11/08/15 180 lb 8 oz (81.874 kg)  07/17/15 178 lb 8 oz (80.967 kg)      Other studies Reviewed: Additional studies/ records that were reviewed today include:   Echo 10/12/2013 EF 50-55%, moderate AR, severely increased posterior wall thickness.  Review of the above records demonstrates:  Significant residual on last cath in 2010, moderate AR on last echo in 2014, recent chest pain, upcoming L4/L5 back surgery   ASSESSMENT AND PLAN:  1. Preop clearance pending right L4-5 microdiscectomy on 11/13/2015  - her last cath was in 2010, notably had 90% distal LAD stenosis and 50% ost LM stenosis and mild ISR of RCA stent. The 90%  distal LAD stenosis likely does not perfuse as much heart muscle, however I am more concerned about her 50% ost LM stenosis. She recently had some chest pain and pin and needle sensation, pin and needle sensation likely noncardiac, chest pain somewhat atypical. However given her previous history, either way, she would not be low risk with spinal surgery under general anesthesia. The question is whether she is moderate vs high risk.   - per patient, her father had MI and CABG, and she really does not believe she wants another cath again. However it is still a good idea to risk stratify her based on echo and myoview, if myoview is very high risk, maybe she will change her mind. Surgery will have to be delayed until further workup. I will make her continue holding plavix for now, however if surgery will be delayed longer, we can always put her back later. She need be off plavix 7 days prior to surgical procedure.  - that being said, her functional ability is greatly limited by back pain shooting down the right side, therefore she likely depend on this surgery to help her improve her current quality of life.  2. CAD s/p PCI to RCA with 50% LM and 90% distal LAD residual  - Echo 10/12/2013 EF 50-55%, moderate AR, severely increased posterior wall thickness.  - repeat echo to check LV function and degree of AR  3. HLD intolerant to statin: currently on zetia  4. HTN: on amlodipine 10mg , Imdur 90mg , Toprol XL 50mg  daily and spironolactone 25mg  BID  5. Prediabetes: A1C 5.8, diet controlled  6. Hypothyroidism: on synthroid     Current medicines are reviewed at length with the patient today.  The patient does not have concerns regarding medicines.  The following changes have been made:  Continue holding plavix until cardiac eval complete  Labs/ tests ordered today include:   Orders Placed This Encounter  Procedures  . Myocardial Perfusion Imaging  . EKG 12-Lead  . Echocardiogram      Disposition:   FU with  Dr. Jari Sportsman office in 1 weeks  Signed, Azalee Course PA 11/09/2015 1:11 PM    [facility] [address] [place], [place], [place]  [address] Phone: [phone]; Fax: [phone]

## 2015-11-09 NOTE — Patient Instructions (Signed)
Medication Instructions:  Your physician recommends that you continue on your current medications as directed. Please refer to the Current Medication list given to you today.   Labwork: None ordered  Testing/Procedures: Your physician has requested that you have a lexiscan myoview. For further information please visit [email or web address] Please follow instruction sheet, as given.   Your physician has requested that you have an echocardiogram. Echocardiography is a painless test that uses sound waves to create images of your heart. It provides your doctor with information about the size and shape of your heart and how well your heart's chambers and valves are working. This procedure takes approximately one hour. There are no restrictions for this procedure.    Follow-Up: Your physician recommends that you schedule a follow-up appointment in: 1-2 WEEKS AFTER TEST WITH DR. Kirke Corin (IN [place]) OR 1ST AVAILABLE APP IN [place]   Any Other Special Instructions Will Be Listed Below (If Applicable).  Pharmacologic Stress Electrocardiogram A pharmacologic stress electrocardiogram is a heart (cardiac) test that uses nuclear imaging to evaluate the blood supply to your heart. This test may also be called a pharmacologic stress electrocardiography. Pharmacologic means that a medicine is used to increase your heart rate and blood pressure.  This stress test is done to find areas of poor blood flow to the heart by determining the extent of coronary artery disease (CAD). Some people exercise on a treadmill, which naturally increases the blood flow to the heart. For those people unable to exercise on a treadmill, a medicine is used. This medicine stimulates your heart and will cause your heart to beat harder and more quickly, as if you were exercising.  Pharmacologic stress tests can help determine:  The adequacy of blood flow to your heart during increased levels of activity in order to clear you for  discharge home.  The extent of coronary artery blockage caused by CAD.  Your prognosis if you have suffered a heart attack.  The effectiveness of cardiac procedures done, such as an angioplasty, which can increase the circulation in your coronary arteries.  Causes of chest pain or pressure. LET [facility] CARE PROVIDER KNOW ABOUT:  Any allergies you have.  All medicines you are taking, including vitamins, herbs, eye drops, creams, and over-the-counter medicines.  Previous problems you or members of your family have had with the use of anesthetics.  Any blood disorders you have.  Previous surgeries you have had.  Medical conditions you have.  Possibility of pregnancy, if this applies.  If you are currently breastfeeding. RISKS AND COMPLICATIONS Generally, this is a safe procedure. However, as with any procedure, complications can occur. Possible complications include:  You develop pain or pressure in the following areas:  Chest.  Jaw or neck.  Between your shoulder blades.  Radiating down your left arm.  Headache.  Dizziness or light-headedness.  Shortness of breath.  Increased or irregular heartbeat.  Low blood pressure.  Nausea or vomiting.  Flushing.  Redness going up the arm and slight pain during injection of medicine.  Heart attack (rare). BEFORE THE PROCEDURE   Avoid all forms of caffeine for 24 hours before your test or as directed by your health care provider. This includes coffee, tea (even decaffeinated tea), caffeinated sodas, chocolate, cocoa, and certain pain medicines.  Follow your health care provider's instructions regarding eating and drinking before the test.  Take your medicines as directed at regular times with water unless instructed otherwise. Exceptions may include:  If you have diabetes, ask how you  are to take your insulin or pills. It is common to adjust insulin dosing the morning of the test.  If you are taking beta-blocker  medicines, it is important to talk to your health care provider about these medicines well before the date of your test. Taking beta-blocker medicines may interfere with the test. In some cases, these medicines need to be changed or stopped 24 hours or more before the test.  If you wear a nitroglycerin patch, it may need to be removed prior to the test. Ask your health care provider if the patch should be removed before the test.  If you use an inhaler for any breathing condition, bring it with you to the test.  If you are an outpatient, bring a snack so you can eat right after the stress phase of the test.  Do not smoke for 4 hours prior to the test or as directed by your health care provider.  Do not apply lotions, powders, creams, or oils on your chest prior to the test.  Wear comfortable shoes and clothing. Let your health care provider know if you were unable to complete or follow the preparations for your test. PROCEDURE   Multiple patches (electrodes) will be put on your chest. If needed, small areas of your chest may be shaved to get better contact with the electrodes. Once the electrodes are attached to your body, multiple wires will be attached to the electrodes, and your heart rate will be monitored.  An IV access will be started. A nuclear trace (isotope) is given. The isotope may be given intravenously, or it may be swallowed. Nuclear refers to several types of radioactive isotopes, and the nuclear isotope lights up the arteries so that the nuclear images are clear. The isotope is absorbed by your body. This results in low radiation exposure.  A resting nuclear image is taken to show how your heart functions at rest.  A medicine is given through the IV access.  A second scan is done about 1 hour after the medicine injection and determines how your heart functions under stress.  During this stress phase, you will be connected to an electrocardiogram machine. Your blood pressure  and oxygen levels will be monitored. AFTER THE PROCEDURE   Your heart rate and blood pressure will be monitored after the test.  You may return to your normal schedule, including diet,activities, and medicines, unless your health care provider tells you otherwise.   This information is not intended to replace advice given to you by your health care provider. Make sure you discuss any questions you have with your health care provider.   Document Released: 03/15/2009 Document Revised: 11/01/2013 Document Reviewed: 07/04/2013 Elsevier Interactive Patient Education 2016 [employer] Echocardiogram An echocardiogram, or echocardiography, uses sound waves (ultrasound) to produce an image of your heart. The echocardiogram is simple, painless, obtained within a short period of time, and offers valuable information to your health care provider. The images from an echocardiogram can provide information such as:  Evidence of coronary artery disease (CAD).  Heart size.  Heart muscle function.  Heart valve function.  Aneurysm detection.  Evidence of a past heart attack.  Fluid buildup around the heart.  Heart muscle thickening.  Assess heart valve function. LET [facility] PROVIDER KNOW ABOUT:  Any allergies you have.  All medicines you are taking, including vitamins, herbs, eye drops, creams, and over-the-counter medicines.  Previous problems you or members of your family have had with the use of  anesthetics.  Any blood disorders you have.  Previous surgeries you have had.  Medical conditions you have.  Possibility of pregnancy, if this applies. BEFORE THE PROCEDURE  No special preparation is needed. Eat and drink normally.  PROCEDURE   In order to produce an image of your heart, gel will be applied to your chest and a wand-like tool (transducer) will be moved over your chest. The gel will help transmit the sound waves from the transducer. The sound waves will harmlessly  bounce off your heart to allow the heart images to be captured in real-time motion. These images will then be recorded.  You may need an IV to receive a medicine that improves the quality of the pictures. AFTER THE PROCEDURE You may return to your normal schedule including diet, activities, and medicines, unless your health care provider tells you otherwise.   This information is not intended to replace advice given to you by your health care provider. Make sure you discuss any questions you have with your health care provider.   Document Released: 10/24/2000 Document Revised: 11/17/2014 Document Reviewed: 07/04/2013 Elsevier Interactive Patient Education [employer]    If you need a refill on your cardiac medications before your next appointment, please call your pharmacy.

## 2015-11-13 ENCOUNTER — Encounter (HOSPITAL_COMMUNITY): Admission: RE | Payer: Self-pay | Source: Ambulatory Visit

## 2015-11-13 ENCOUNTER — Telehealth (HOSPITAL_COMMUNITY): Payer: Self-pay

## 2015-11-13 ENCOUNTER — Ambulatory Visit (HOSPITAL_COMMUNITY): Admission: RE | Admit: 2015-11-13 | Payer: Medicare Other | Source: Ambulatory Visit | Admitting: Neurosurgery

## 2015-11-13 SURGERY — LUMBAR LAMINECTOMY/DECOMPRESSION MICRODISCECTOMY 1 LEVEL
Anesthesia: General | Site: Back | Laterality: Right

## 2015-11-13 NOTE — Telephone Encounter (Signed)
Encounter complete. 

## 2015-11-14 ENCOUNTER — Ambulatory Visit (HOSPITAL_BASED_OUTPATIENT_CLINIC_OR_DEPARTMENT_OTHER)
Admission: RE | Admit: 2015-11-14 | Discharge: 2015-11-14 | Disposition: A | Discharge status: Home / Self Care | Payer: Medicare Other | Source: Ambulatory Visit | Attending: Physician Assistant | Admitting: Physician Assistant

## 2015-11-14 ENCOUNTER — Ambulatory Visit (HOSPITAL_COMMUNITY)
Admission: RE | Admit: 2015-11-14 | Discharge: 2015-11-14 | Disposition: A | Discharge status: Home / Self Care | Payer: Medicare Other | Source: Ambulatory Visit | Attending: Physician Assistant | Admitting: Physician Assistant

## 2015-11-14 DIAGNOSIS — R0609 Other forms of dyspnea: Secondary | ICD-10-CM | POA: Insufficient documentation

## 2015-11-14 DIAGNOSIS — I25119 Atherosclerotic heart disease of native coronary artery with unspecified angina pectoris: Secondary | ICD-10-CM

## 2015-11-14 DIAGNOSIS — E119 Type 2 diabetes mellitus without complications: Secondary | ICD-10-CM | POA: Diagnosis not present

## 2015-11-14 DIAGNOSIS — F17200 Nicotine dependence, unspecified, uncomplicated: Secondary | ICD-10-CM | POA: Insufficient documentation

## 2015-11-14 DIAGNOSIS — I358 Other nonrheumatic aortic valve disorders: Secondary | ICD-10-CM | POA: Diagnosis not present

## 2015-11-14 DIAGNOSIS — I351 Nonrheumatic aortic (valve) insufficiency: Secondary | ICD-10-CM | POA: Diagnosis not present

## 2015-11-14 DIAGNOSIS — I517 Cardiomegaly: Secondary | ICD-10-CM | POA: Diagnosis not present

## 2015-11-14 DIAGNOSIS — E785 Hyperlipidemia, unspecified: Secondary | ICD-10-CM | POA: Insufficient documentation

## 2015-11-14 DIAGNOSIS — R079 Chest pain, unspecified: Secondary | ICD-10-CM | POA: Diagnosis not present

## 2015-11-14 DIAGNOSIS — I10 Essential (primary) hypertension: Secondary | ICD-10-CM | POA: Diagnosis not present

## 2015-11-14 DIAGNOSIS — Z8249 Family history of ischemic heart disease and other diseases of the circulatory system: Secondary | ICD-10-CM | POA: Diagnosis not present

## 2015-11-14 DIAGNOSIS — R9439 Abnormal result of other cardiovascular function study: Secondary | ICD-10-CM | POA: Insufficient documentation

## 2015-11-14 LAB — MYOCARDIAL PERFUSION IMAGING
CHL CUP NUCLEAR SDS: 5
CHL CUP RESTING HR STRESS: 57 {beats}/min
LV dias vol: 95 mL
LVSYSVOL: 33 mL
NUC STRESS TID: 1.14
Peak HR: 74 {beats}/min
SRS: 2
SSS: 7

## 2015-11-14 MED ORDER — REGADENOSON 0.4 MG/5ML IV SOLN
0.4000 mg | Freq: Once | INTRAVENOUS | Status: AC
  Administered 2015-11-14: 0.4 mg via INTRAVENOUS

## 2015-11-14 MED ORDER — TECHNETIUM TC 99M SESTAMIBI GENERIC - CARDIOLITE
10.1000 | Freq: Once | INTRAVENOUS | Status: AC | PRN
  Administered 2015-11-14: 10.1 via INTRAVENOUS

## 2015-11-14 MED ORDER — TECHNETIUM TC 99M SESTAMIBI GENERIC - CARDIOLITE
31.2000 | Freq: Once | INTRAVENOUS | Status: AC | PRN
  Administered 2015-11-14: 31.2 via INTRAVENOUS

## 2015-11-14 MED ORDER — AMINOPHYLLINE 25 MG/ML IV SOLN
75.0000 mg | Freq: Once | INTRAVENOUS | Status: AC
  Administered 2015-11-14: 75 mg via INTRAVENOUS

## 2015-11-15 ENCOUNTER — Encounter: Payer: Self-pay | Admitting: Physician Assistant

## 2015-11-15 ENCOUNTER — Telehealth: Payer: Self-pay

## 2015-11-15 NOTE — Telephone Encounter (Signed)
New message      Pt is due to have surgery tomorrow.  She recently had test here.  She sees Dr Kirke CorinArida in [place].  Husband is calling to see if she will be cleared for surgery tomorrow.  Please call

## 2015-11-15 NOTE — Telephone Encounter (Signed)
F/u   Pt husband following up on myoview results. Please call back and discuss.

## 2015-11-15 NOTE — Telephone Encounter (Signed)
S/w pt who inquires of the results of nuclear stress test and echo that were performed yesterday in [place]. Pt [facility] by Azalee CourseHao Meng, PA for further testing before spine surgery on 11/16/15. Advised pt that Dr. Kirke CorinArida is out of office and to [facility] for results and further instructions. Pt asks I speak with Rhonda Park to provide phone number as she needs to lie down d/t back pain. Reviewed instructions w/Rhonda Park who verbalized understanding.

## 2015-11-15 NOTE — Telephone Encounter (Signed)
Called and spoke with patient.  Informed her that Rhonda Park wants to have Dr. Kirke Park review results to make decision on surgery clearance. Informed patient that [facility] and will have them address these results.   Will forward this to nurse/triage in [facility]  Patient also request that we call her daughter to discuss results and if she is cleared for surgery. She does not want us discussing her healthcare with Rhonda Park (ex-husband).   Explained to her that DPR on file gives us permission to do so and advised patient to update this next time she is in the office.

## 2015-11-15 NOTE — Telephone Encounter (Signed)
Pt friend called, states pt is to have surgery tomorrow, Dr. Jordan LikesPool. on her ruptured disc. States pt had stress test yesterday in [place]. Please call and advise if pt is ok to have surgery

## 2015-11-15 NOTE — Telephone Encounter (Signed)
Informed patient's husband (DPR on file) that test results have not been reviewed by Azalee CourseHao Meng, PA yet.  (tests were performed yesterday) Will send information to him to review tests as patient is waiting on decision for back surgery. Husband understands office will call once results are reviewed.

## 2015-11-16 NOTE — Telephone Encounter (Signed)
This encounter was created in error - please disregard.

## 2015-11-19 ENCOUNTER — Telehealth: Payer: Self-pay | Admitting: *Deleted

## 2015-11-19 ENCOUNTER — Telehealth: Payer: Self-pay

## 2015-11-19 NOTE — Telephone Encounter (Signed)
S/w pt after caregiver called. Reviewed stress test results and recommendations w/pt who verbalized understanding.  See result note

## 2015-11-19 NOTE — Telephone Encounter (Signed)
Please call Bridgett's caregiver Stephannie PetersMilton for results for stress test to ok for surgery. Thanks

## 2015-11-19 NOTE — Telephone Encounter (Signed)
S/w pt who is confused as to who called her earlier today. States she thinks someone from our office called her emergency contact, Milton, around 3pm. Reminded pt of our conversation at 2:10pm today of low risk for surgery. Pt verbalized understanding to reschedule with surgeon and have his office contact us for clearance.

## 2015-11-20 ENCOUNTER — Telehealth: Payer: Self-pay | Admitting: Cardiovascular Disease

## 2015-11-20 NOTE — Telephone Encounter (Signed)
S/w pt who states back surgery has been rescheduled for Feb 17. States she is getting weaker and has difficulty walking while awaiting her surgery. Pt inquired of lab results. Informed pt that labs were ordered as a part of PAT therefore, she would need to call MD who ordered labs. Pt verbalized understanding and is appreciative of the call.

## 2015-11-20 NOTE — Telephone Encounter (Signed)
Follow Up  Pt is returning the call for lab results

## 2015-11-22 ENCOUNTER — Telehealth: Payer: Self-pay | Admitting: Physician Assistant

## 2015-11-22 ENCOUNTER — Telehealth: Payer: Self-pay | Admitting: *Deleted

## 2015-11-22 ENCOUNTER — Telehealth: Payer: Self-pay

## 2015-11-22 NOTE — Telephone Encounter (Signed)
Faxed clearance to Dr. Dutch QuintPoole, [facility], [phone]. Informed pt of clearance.

## 2015-11-22 NOTE — Telephone Encounter (Signed)
Rhonda Park, pt caregiver, called and is asking about clearance for her surgery. Please call.

## 2015-11-22 NOTE — Telephone Encounter (Signed)
-----   Message from TrentHao Meng, [place] sent at 11/20/2015  3:14 PM EST ----- Normal pumping function of the heart. Mild leakage of aortic valve, normal given her age.

## 2015-11-22 NOTE — Telephone Encounter (Signed)
Per Azalee CourseHao Meng, PA-C, called pt re:  ECHO results.  Pt has been advised that her ECHO was normal, with normal heart pumping function.  Pt is very appreciative and ready for her back sx.  Has asked for us to contact Dr. Shirlean MylarPooles' office to let them know.  I have send Azalee CourseHao Meng, PA-C a message and am waiting for his response.  As soon as I get that response, Dr. Ethelene BrownsPoole's office will be notified.  Pt verbalized appreciation and understanding.

## 2015-11-22 NOTE — Telephone Encounter (Signed)
Patient's echo is reasurring, normal EF, no regional wall motion abnormality. Stress test mildly abnormal, but cleared by Dr. Kirke CorinArida on 1/8 for surgery, Dr. Kirke CorinArida felt stress test was low risk after reviewing it.   Ramond DialSigned, Audray Rumore PA Pager: [phone]

## 2015-11-27 ENCOUNTER — Telehealth: Payer: Self-pay | Admitting: Physician Assistant

## 2015-11-27 NOTE — Telephone Encounter (Signed)
Developed hive blisters on chest area, little places.  Rash around her neck.  Started 2 days after she got home a week or so gain. Not getting better, worse it is same.  Equally bilateral. used benadryl by mouth.Has talked to Dr. Gordy Councilman and is getting medcation from him for pharmacy. 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

## 2015-11-27 NOTE — Telephone Encounter (Signed)
Pt wants to know if any fish products were in the dye they used for the stress test. Pt is allergic to fish.

## 2015-11-28 ENCOUNTER — Ambulatory Visit: Payer: Self-pay | Admitting: Cardiovascular Disease

## 2015-11-29 ENCOUNTER — Telehealth: Payer: Self-pay | Admitting: *Deleted

## 2015-11-29 MED ORDER — CEFAZOLIN SODIUM-DEXTROSE 2-3 GM-% IV SOLR
2.0000 g | INTRAVENOUS | Status: AC
  Administered 2015-11-30: 2 g via INTRAVENOUS
  Filled 2015-11-29: qty 50

## 2015-11-29 MED ORDER — DEXAMETHASONE SODIUM PHOSPHATE 10 MG/ML IJ SOLN
10.0000 mg | INTRAMUSCULAR | Status: AC
  Administered 2015-11-30: 10 mg via INTRAVENOUS
  Filled 2015-11-29: qty 1

## 2015-11-29 NOTE — Progress Notes (Signed)
Mrs Reisch reports that she has not taken Plavix since December 31, "I stopped it prior to the time I was schedule earlier, oh I wonder if my blood is too thick."  I called Dr Jari Sportsman office and a message was sent to his nurse.

## 2015-11-29 NOTE — Telephone Encounter (Signed)
Her stent was a long time ago so that should be fine.

## 2015-11-29 NOTE — Telephone Encounter (Signed)
Routed response to [employer]

## 2015-11-29 NOTE — Telephone Encounter (Signed)
Symptom is very unusual. We have patient with shellfish allergy undergo stress test before without any symptom. I confirmed with nuclear medicine staff at [facility] just to make sure, there is no obvious interaction between stress test and shell fish allergy. I tried to call patient on both her mobile and home phone, no answer

## 2015-11-29 NOTE — Telephone Encounter (Signed)
Call pt home number. No answer, no VM. Called cell number and spoke w/Milton Benjamine Mola who states pt is home sleeping. States he can answer questions regarding pt as he dispenses her medications. Per Mr. Cavallero, pt has not taken plavix "since 5 days before January 6th which was the original date of her back surgery".  Will forward to Dr. Kirke Corin to make aware and advise Liborio Nixon in PAT.

## 2015-11-29 NOTE — Telephone Encounter (Signed)
Pre admit Rhonda Park Calling having just one question on clearance we did on patient.  Surgery is tmrw but we cleared a while ago.   1. Pt has be off her Plavix since 11/09/16 she is now a bit concerned about   Please advise.   Please route note back to [facility]

## 2015-11-29 NOTE — Progress Notes (Signed)
Anesthesia Chart Review: SAME DAY WORK-UP.   Patient is a 75 year old female scheduled for right L4-5 microdiscectomy on 11/30/15 by Dr. Jordan Likes. Case was postponed from 11/13/15 due to need for cardiac clearance (see my note from 12/08/14) due to atypical chest pain with known CAD history.  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 PCP is Dr. Lacie Scotts.   Cardiologist is Dr. Kirke Corin. She was seen by Azalee Course, PA for pre-operative cardiac clearance. Stress and echo ordered (see below).  On 11/12/15, he wrote: "Patient's echo is reasurring, normal EF, no regional wall motion abnormality. Stress test mildly abnormal, but cleared by Dr. Kirke Corin on 1/8 for surgery, Dr. Kirke Corin felt stress test was low risk after reviewing it."  Meds include amlodipine, Klonopin, Plavix (on hold since 11/10/15), Nexium, Zetia, Imdur, levothyroxine, Toprol-XL, Zoloft, Aldactone.  11/08/15 EKG: SB at 59 bpm, non-specific ST/T wave abnormality.  11/14/15 Echo: Study Conclusions - Left ventricle: The cavity size was normal. Systolic function was normal. The estimated ejection fraction was in the range of 60% to 65%. Wall motion was normal; there were no regional wall motion abnormalities. Doppler parameters are consistent with abnormal left ventricular relaxation (grade 1 diastolic dysfunction). Doppler parameters are consistent with high ventricular filling pressure. - Aortic valve: Trileaflet; mildly thickened, mildly calcified leaflets. There was mild regurgitation. - Left atrium: The atrium was mildly dilated.  11/14/15 Nuclear stress test:  The left ventricular ejection fraction is hyperdynamic (>65%).  Nuclear stress EF: 66%.  Defect 1: There is a medium defect of mild severity  present in the basal anterolateral and mid anterolateral location.  Findings consistent with ischemia.  This is a low risk study. 1. Low risk study 2. Nl EF 3. Subtle anterolateral wall ischemia. Clinical correlation required  03/29/09 Cardiac cath (scanned under Media tab) showed: 50% left main, 20% LAD, 90% distal LAD, 80% D1, 40% RAMUS INT, 30% RCA. LVEF 65%. Impression: LM stenosis is slightly worse since last cath but still close to 50%, mild in stent restenosis in RCA, the distal LAD lesion is close to the apex.   12/02/13 Abdominal duplex: Normal caliber abdominal aorta, common and external iliac arteries.   10/12/13 Carotid duplex: < 50% bilateral ICA stenosis.  She is for repeat labs on arrival.  Reviewed cardiac testing results and cardiac clearance with anesthesiologist Dr. Jacklynn Bue. No new recommendations since cardiology has evaluated and reviewed testing and still felt patient was okay for surgery.   Velna Ochs [facility] Short Stay Center/Anesthesiology Phone [phone] 11/29/2015 2:49 PM

## 2015-11-29 NOTE — Telephone Encounter (Signed)
Called patient and let husband know that there are no fish products in the contrast that they used on his wife with testing.

## 2015-11-30 ENCOUNTER — Encounter (HOSPITAL_COMMUNITY)
Admission: RE | Disposition: A | Discharge status: Home / Self Care | Payer: Self-pay | Source: Ambulatory Visit | Attending: Neurosurgery

## 2015-11-30 ENCOUNTER — Encounter (HOSPITAL_COMMUNITY): Payer: Self-pay | Admitting: Certified Registered Nurse Anesthetist

## 2015-11-30 ENCOUNTER — Inpatient Hospital Stay (HOSPITAL_COMMUNITY): Payer: Medicare Other | Admitting: Vascular Surgery

## 2015-11-30 ENCOUNTER — Inpatient Hospital Stay (HOSPITAL_COMMUNITY)
Admission: RE | Admit: 2015-11-30 | Discharge: 2015-12-01 | DRG: 519 | Disposition: A | Discharge status: Home / Self Care | Payer: Medicare Other | Source: Ambulatory Visit | Attending: Neurosurgery | Admitting: Neurosurgery

## 2015-11-30 ENCOUNTER — Observation Stay (HOSPITAL_COMMUNITY): Payer: Medicare Other

## 2015-11-30 DIAGNOSIS — I517 Cardiomegaly: Secondary | ICD-10-CM | POA: Diagnosis present

## 2015-11-30 DIAGNOSIS — F17210 Nicotine dependence, cigarettes, uncomplicated: Secondary | ICD-10-CM | POA: Diagnosis present

## 2015-11-30 DIAGNOSIS — E039 Hypothyroidism, unspecified: Secondary | ICD-10-CM | POA: Diagnosis present

## 2015-11-30 DIAGNOSIS — Z8673 Personal history of transient ischemic attack (TIA), and cerebral infarction without residual deficits: Secondary | ICD-10-CM

## 2015-11-30 DIAGNOSIS — F419 Anxiety disorder, unspecified: Secondary | ICD-10-CM | POA: Diagnosis present

## 2015-11-30 DIAGNOSIS — E785 Hyperlipidemia, unspecified: Secondary | ICD-10-CM | POA: Diagnosis present

## 2015-11-30 DIAGNOSIS — I2510 Atherosclerotic heart disease of native coronary artery without angina pectoris: Secondary | ICD-10-CM | POA: Diagnosis present

## 2015-11-30 DIAGNOSIS — Z79899 Other long term (current) drug therapy: Secondary | ICD-10-CM | POA: Diagnosis not present

## 2015-11-30 DIAGNOSIS — M5116 Intervertebral disc disorders with radiculopathy, lumbar region: Principal | ICD-10-CM | POA: Diagnosis present

## 2015-11-30 DIAGNOSIS — Z7902 Long term (current) use of antithrombotics/antiplatelets: Secondary | ICD-10-CM

## 2015-11-30 DIAGNOSIS — M5126 Other intervertebral disc displacement, lumbar region: Secondary | ICD-10-CM

## 2015-11-30 DIAGNOSIS — Q619 Cystic kidney disease, unspecified: Secondary | ICD-10-CM

## 2015-11-30 DIAGNOSIS — E119 Type 2 diabetes mellitus without complications: Secondary | ICD-10-CM | POA: Diagnosis present

## 2015-11-30 DIAGNOSIS — Z981 Arthrodesis status: Secondary | ICD-10-CM

## 2015-11-30 DIAGNOSIS — J449 Chronic obstructive pulmonary disease, unspecified: Secondary | ICD-10-CM | POA: Diagnosis present

## 2015-11-30 DIAGNOSIS — K219 Gastro-esophageal reflux disease without esophagitis: Secondary | ICD-10-CM | POA: Diagnosis present

## 2015-11-30 DIAGNOSIS — M545 Low back pain: Secondary | ICD-10-CM | POA: Diagnosis present

## 2015-11-30 DIAGNOSIS — M1990 Unspecified osteoarthritis, unspecified site: Secondary | ICD-10-CM | POA: Diagnosis present

## 2015-11-30 DIAGNOSIS — Z9889 Other specified postprocedural states: Secondary | ICD-10-CM

## 2015-11-30 HISTORY — PX: LUMBAR LAMINECTOMY/DECOMPRESSION MICRODISCECTOMY: SHX5026

## 2015-11-30 LAB — BASIC METABOLIC PANEL
Anion gap: 12 (ref 5–15)
BUN: 22 mg/dL — AB (ref 6–20)
CALCIUM: 9.5 mg/dL (ref 8.9–10.3)
CO2: 23 mmol/L (ref 22–32)
CREATININE: 1.05 mg/dL — AB (ref 0.44–1.00)
Chloride: 108 mmol/L (ref 101–111)
GFR calc Af Amer: 60 mL/min — ABNORMAL LOW (ref >60)
GFR calc non Af Amer: 52 mL/min — ABNORMAL LOW (ref >60)
GLUCOSE: 103 mg/dL — AB (ref 65–99)
Potassium: 4.8 mmol/L (ref 3.5–5.1)
Sodium: 143 mmol/L (ref 135–145)

## 2015-11-30 LAB — CBC
HEMATOCRIT: 36.1 % (ref 36.0–46.0)
Hemoglobin: 11.8 g/dL — ABNORMAL LOW (ref 12.0–15.0)
MCH: 30.3 pg (ref 26.0–34.0)
MCHC: 32.7 g/dL (ref 30.0–36.0)
MCV: 92.8 fL (ref 78.0–100.0)
Platelets: 66 10*3/uL — ABNORMAL LOW (ref 150–400)
RBC: 3.89 MIL/uL (ref 3.87–5.11)
RDW: 13.9 % (ref 11.5–15.5)
WBC: 6 10*3/uL (ref 4.0–10.5)

## 2015-11-30 LAB — GLUCOSE, CAPILLARY
GLUCOSE-CAPILLARY: 246 mg/dL — AB (ref 65–99)
Glucose-Capillary: 159 mg/dL — ABNORMAL HIGH (ref 65–99)

## 2015-11-30 SURGERY — LUMBAR LAMINECTOMY/DECOMPRESSION MICRODISCECTOMY 1 LEVEL
Anesthesia: General | Site: Back | Laterality: Right

## 2015-11-30 MED ORDER — ARTIFICIAL TEARS OP OINT
TOPICAL_OINTMENT | OPHTHALMIC | Status: DC | PRN
  Administered 2015-11-30: 1 via OPHTHALMIC

## 2015-11-30 MED ORDER — LACTATED RINGERS IV SOLN
INTRAVENOUS | Status: DC
  Administered 2015-11-30 (×3): via INTRAVENOUS

## 2015-11-30 MED ORDER — ONDANSETRON HCL 4 MG/2ML IJ SOLN
INTRAMUSCULAR | Status: AC
  Filled 2015-11-30: qty 2

## 2015-11-30 MED ORDER — HYDROMORPHONE HCL 1 MG/ML IJ SOLN
INTRAMUSCULAR | Status: AC
  Filled 2015-11-30: qty 1

## 2015-11-30 MED ORDER — HEMOSTATIC AGENTS (NO CHARGE) OPTIME
TOPICAL | Status: DC | PRN
  Administered 2015-11-30: 1 via TOPICAL

## 2015-11-30 MED ORDER — GLYCOPYRROLATE 0.2 MG/ML IJ SOLN
INTRAMUSCULAR | Status: DC | PRN
  Administered 2015-11-30: 0.6 mg via INTRAVENOUS

## 2015-11-30 MED ORDER — PHENOL 1.4 % MT LIQD: 1.0000 | OROMUCOSAL | Status: DC | PRN

## 2015-11-30 MED ORDER — CYCLOBENZAPRINE HCL 10 MG PO TABS: 10.0000 mg | ORAL_TABLET | Freq: Three times a day (TID) | ORAL | Status: DC | PRN

## 2015-11-30 MED ORDER — ISOSORBIDE MONONITRATE ER 60 MG PO TB24
90.0000 mg | ORAL_TABLET | Freq: Every day | ORAL | Status: DC
  Filled 2015-11-30 (×2): qty 1

## 2015-11-30 MED ORDER — AMLODIPINE BESYLATE 10 MG PO TABS
10.0000 mg | ORAL_TABLET | Freq: Every day | ORAL | Status: DC
  Filled 2015-11-30: qty 1

## 2015-11-30 MED ORDER — MENTHOL 3 MG MT LOZG: 1.0000 | LOZENGE | OROMUCOSAL | Status: DC | PRN

## 2015-11-30 MED ORDER — LEVOTHYROXINE SODIUM 100 MCG PO TABS
100.0000 ug | ORAL_TABLET | Freq: Every day | ORAL | Status: DC
  Administered 2015-12-01: 100 ug via ORAL
  Filled 2015-11-30: qty 1

## 2015-11-30 MED ORDER — METOPROLOL SUCCINATE ER 25 MG PO TB24: 50.0000 mg | ORAL_TABLET | Freq: Every day | ORAL | Status: DC

## 2015-11-30 MED ORDER — FOLIC ACID 400 MCG PO TABS: 400.0000 ug | ORAL_TABLET | Freq: Every day | ORAL | Status: DC

## 2015-11-30 MED ORDER — FENTANYL CITRATE (PF) 250 MCG/5ML IJ SOLN
INTRAMUSCULAR | Status: AC
  Filled 2015-11-30: qty 5

## 2015-11-30 MED ORDER — SUCCINYLCHOLINE CHLORIDE 20 MG/ML IJ SOLN
INTRAMUSCULAR | Status: AC
  Filled 2015-11-30: qty 1

## 2015-11-30 MED ORDER — CLOPIDOGREL BISULFATE 75 MG PO TABS
75.0000 mg | ORAL_TABLET | Freq: Every day | ORAL | Status: DC
  Filled 2015-11-30 (×2): qty 1

## 2015-11-30 MED ORDER — ONDANSETRON HCL 4 MG/2ML IJ SOLN: 4.0000 mg | INTRAMUSCULAR | Status: DC | PRN

## 2015-11-30 MED ORDER — SERTRALINE HCL 50 MG PO TABS
100.0000 mg | ORAL_TABLET | Freq: Every day | ORAL | Status: DC
  Administered 2015-12-01: 100 mg via ORAL
  Filled 2015-11-30: qty 2

## 2015-11-30 MED ORDER — PANTOPRAZOLE SODIUM 40 MG PO TBEC
40.0000 mg | DELAYED_RELEASE_TABLET | Freq: Every day | ORAL | Status: DC
  Administered 2015-12-01: 40 mg via ORAL
  Filled 2015-11-30: qty 1

## 2015-11-30 MED ORDER — SPIRONOLACTONE 25 MG PO TABS
25.0000 mg | ORAL_TABLET | Freq: Two times a day (BID) | ORAL | Status: DC
  Administered 2015-12-01: 25 mg via ORAL
  Filled 2015-11-30 (×4): qty 1

## 2015-11-30 MED ORDER — SODIUM CHLORIDE 0.9 % IR SOLN
Status: DC | PRN
  Administered 2015-11-30: 13:00:00

## 2015-11-30 MED ORDER — ACETAMINOPHEN 325 MG PO TABS: 650.0000 mg | ORAL_TABLET | ORAL | Status: DC | PRN

## 2015-11-30 MED ORDER — PROPOFOL 10 MG/ML IV BOLUS
INTRAVENOUS | Status: AC
  Filled 2015-11-30: qty 20

## 2015-11-30 MED ORDER — KETOROLAC TROMETHAMINE 30 MG/ML IJ SOLN
INTRAMUSCULAR | Status: AC
  Filled 2015-11-30: qty 1

## 2015-11-30 MED ORDER — NEOSTIGMINE METHYLSULFATE 10 MG/10ML IV SOLN
INTRAVENOUS | Status: DC | PRN
  Administered 2015-11-30: 4 mg via INTRAVENOUS

## 2015-11-30 MED ORDER — SODIUM CHLORIDE 0.9 % IJ SOLN: 3.0000 mL | INTRAMUSCULAR | Status: DC | PRN

## 2015-11-30 MED ORDER — EPHEDRINE SULFATE 50 MG/ML IJ SOLN
INTRAMUSCULAR | Status: AC
  Filled 2015-11-30: qty 1

## 2015-11-30 MED ORDER — THROMBIN 5000 UNITS EX SOLR
CUTANEOUS | Status: DC | PRN
Start: 2015-11-30 — End: 2015-11-30
  Administered 2015-11-30 (×2): 5000 [IU] via TOPICAL

## 2015-11-30 MED ORDER — EZETIMIBE 10 MG PO TABS
10.0000 mg | ORAL_TABLET | Freq: Every day | ORAL | Status: DC
  Filled 2015-11-30: qty 1

## 2015-11-30 MED ORDER — PROPOFOL 10 MG/ML IV BOLUS
INTRAVENOUS | Status: DC | PRN
  Administered 2015-11-30: 120 mg via INTRAVENOUS

## 2015-11-30 MED ORDER — PHENYLEPHRINE 40 MCG/ML (10ML) SYRINGE FOR IV PUSH (FOR BLOOD PRESSURE SUPPORT)
PREFILLED_SYRINGE | INTRAVENOUS | Status: AC
  Filled 2015-11-30: qty 10

## 2015-11-30 MED ORDER — GLYCOPYRROLATE 0.2 MG/ML IJ SOLN
INTRAMUSCULAR | Status: AC
  Filled 2015-11-30: qty 3

## 2015-11-30 MED ORDER — VITAMIN D 1000 UNITS PO TABS
5000.0000 [IU] | ORAL_TABLET | Freq: Every day | ORAL | Status: DC
  Filled 2015-11-30: qty 5

## 2015-11-30 MED ORDER — CLONAZEPAM 0.5 MG PO TABS
1.0000 mg | ORAL_TABLET | Freq: Two times a day (BID) | ORAL | Status: DC
  Administered 2015-11-30 – 2015-12-01 (×2): 1 mg via ORAL
  Filled 2015-11-30 (×2): qty 2

## 2015-11-30 MED ORDER — KETOROLAC TROMETHAMINE 30 MG/ML IJ SOLN
30.0000 mg | Freq: Four times a day (QID) | INTRAMUSCULAR | Status: DC
  Administered 2015-11-30 – 2015-12-01 (×3): 30 mg via INTRAVENOUS
  Filled 2015-11-30 (×3): qty 1

## 2015-11-30 MED ORDER — HYDROCODONE-ACETAMINOPHEN 5-325 MG PO TABS
1.0000 | ORAL_TABLET | ORAL | Status: DC | PRN
  Administered 2015-12-01: 2 via ORAL
  Filled 2015-11-30: qty 2

## 2015-11-30 MED ORDER — INSULIN ASPART 100 UNIT/ML ~~LOC~~ SOLN
0.0000 [IU] | Freq: Every day | SUBCUTANEOUS | Status: DC
  Administered 2015-11-30: 2 [IU] via SUBCUTANEOUS

## 2015-11-30 MED ORDER — BUPIVACAINE HCL (PF) 0.25 % IJ SOLN
INTRAMUSCULAR | Status: DC | PRN
  Administered 2015-11-30: 20 mL

## 2015-11-30 MED ORDER — SODIUM CHLORIDE 0.9 % IJ SOLN
3.0000 mL | Freq: Two times a day (BID) | INTRAMUSCULAR | Status: DC
  Administered 2015-11-30: 3 mL via INTRAVENOUS

## 2015-11-30 MED ORDER — CEFAZOLIN SODIUM 1-5 GM-% IV SOLN
1.0000 g | Freq: Three times a day (TID) | INTRAVENOUS | Status: AC
  Administered 2015-11-30 – 2015-12-01 (×2): 1 g via INTRAVENOUS
  Filled 2015-11-30 (×2): qty 50

## 2015-11-30 MED ORDER — ROCURONIUM BROMIDE 50 MG/5ML IV SOLN
INTRAVENOUS | Status: AC
  Filled 2015-11-30: qty 1

## 2015-11-30 MED ORDER — ACETAMINOPHEN 650 MG RE SUPP: 650.0000 mg | RECTAL | Status: DC | PRN

## 2015-11-30 MED ORDER — LIDOCAINE HCL 4 % MT SOLN
OROMUCOSAL | Status: DC | PRN
  Administered 2015-11-30: 4 mL via TOPICAL

## 2015-11-30 MED ORDER — KETOROLAC TROMETHAMINE 30 MG/ML IJ SOLN
INTRAMUSCULAR | Status: DC | PRN
  Administered 2015-11-30: 30 mg via INTRAVENOUS

## 2015-11-30 MED ORDER — INSULIN ASPART 100 UNIT/ML ~~LOC~~ SOLN: 0.0000 [IU] | Freq: Three times a day (TID) | SUBCUTANEOUS | Status: DC

## 2015-11-30 MED ORDER — EPHEDRINE SULFATE 50 MG/ML IJ SOLN
INTRAMUSCULAR | Status: DC | PRN
  Administered 2015-11-30 (×2): 5 mg via INTRAVENOUS

## 2015-11-30 MED ORDER — PROMETHAZINE HCL 25 MG/ML IJ SOLN: 6.2500 mg | INTRAMUSCULAR | Status: DC | PRN

## 2015-11-30 MED ORDER — ROCURONIUM BROMIDE 100 MG/10ML IV SOLN
INTRAVENOUS | Status: DC | PRN
  Administered 2015-11-30: 10 mg via INTRAVENOUS
  Administered 2015-11-30: 30 mg via INTRAVENOUS

## 2015-11-30 MED ORDER — NEOSTIGMINE METHYLSULFATE 10 MG/10ML IV SOLN
INTRAVENOUS | Status: AC
  Filled 2015-11-30: qty 1

## 2015-11-30 MED ORDER — OXYCODONE-ACETAMINOPHEN 5-325 MG PO TABS: 1.0000 | ORAL_TABLET | ORAL | Status: DC | PRN

## 2015-11-30 MED ORDER — HYDROMORPHONE HCL 1 MG/ML IJ SOLN: 0.5000 mg | INTRAMUSCULAR | Status: DC | PRN

## 2015-11-30 MED ORDER — ONDANSETRON HCL 4 MG/2ML IJ SOLN
INTRAMUSCULAR | Status: DC | PRN
Start: 2015-11-30 — End: 2015-11-30
  Administered 2015-11-30: 4 mg via INTRAVENOUS

## 2015-11-30 MED ORDER — 0.9 % SODIUM CHLORIDE (POUR BTL) OPTIME
TOPICAL | Status: DC | PRN
  Administered 2015-11-30: 1000 mL

## 2015-11-30 MED ORDER — HYDROMORPHONE HCL 1 MG/ML IJ SOLN
0.2500 mg | INTRAMUSCULAR | Status: DC | PRN
  Administered 2015-11-30: 0.5 mg via INTRAVENOUS

## 2015-11-30 MED ORDER — FENTANYL CITRATE (PF) 100 MCG/2ML IJ SOLN
INTRAMUSCULAR | Status: DC | PRN
  Administered 2015-11-30: 50 ug via INTRAVENOUS
  Administered 2015-11-30 (×2): 25 ug via INTRAVENOUS

## 2015-11-30 MED ORDER — LIDOCAINE HCL (CARDIAC) 20 MG/ML IV SOLN
INTRAVENOUS | Status: DC | PRN
  Administered 2015-11-30: 60 mg via INTRAVENOUS

## 2015-11-30 SURGICAL SUPPLY — 45 items
BAG DECANTER FOR FLEXI CONT (MISCELLANEOUS) ×3 IMPLANT
BENZOIN TINCTURE PRP APPL 2/3 (GAUZE/BANDAGES/DRESSINGS) ×3 IMPLANT
BLADE CLIPPER SURG (BLADE) IMPLANT
BRUSH SCRUB EZ PLAIN DRY (MISCELLANEOUS) ×3 IMPLANT
BUR CUTTER 7.0 ROUND (BURR) ×3 IMPLANT
CANISTER SUCT 3000ML PPV (MISCELLANEOUS) ×3 IMPLANT
CLOSURE WOUND 1/2 X4 (GAUZE/BANDAGES/DRESSINGS) ×1
DECANTER SPIKE VIAL GLASS SM (MISCELLANEOUS) ×3 IMPLANT
DRAPE LAPAROTOMY 100X72X124 (DRAPES) ×3 IMPLANT
DRAPE MICROSCOPE LEICA (MISCELLANEOUS) ×3 IMPLANT
DRAPE POUCH INSTRU U-SHP 10X18 (DRAPES) ×3 IMPLANT
DRAPE PROXIMA HALF (DRAPES) IMPLANT
DRAPE SURG 17X23 STRL (DRAPES) ×6 IMPLANT
DRSG OPSITE POSTOP 3X4 (GAUZE/BANDAGES/DRESSINGS) ×3 IMPLANT
DURAPREP 26ML APPLICATOR (WOUND CARE) ×3 IMPLANT
ELECT REM PT RETURN 9FT ADLT (ELECTROSURGICAL) ×3
ELECTRODE REM PT RTRN 9FT ADLT (ELECTROSURGICAL) ×1 IMPLANT
GAUZE SPONGE 4X4 12PLY STRL (GAUZE/BANDAGES/DRESSINGS) ×3 IMPLANT
GAUZE SPONGE 4X4 16PLY XRAY LF (GAUZE/BANDAGES/DRESSINGS) IMPLANT
GLOVE ECLIPSE 9.0 STRL (GLOVE) ×3 IMPLANT
GLOVE EXAM NITRILE LRG STRL (GLOVE) IMPLANT
GLOVE EXAM NITRILE MD LF STRL (GLOVE) IMPLANT
GLOVE EXAM NITRILE XL STR (GLOVE) IMPLANT
GLOVE EXAM NITRILE XS STR PU (GLOVE) IMPLANT
GOWN STRL REUS W/ TWL LRG LVL3 (GOWN DISPOSABLE) IMPLANT
GOWN STRL REUS W/ TWL XL LVL3 (GOWN DISPOSABLE) ×1 IMPLANT
GOWN STRL REUS W/TWL 2XL LVL3 (GOWN DISPOSABLE) IMPLANT
GOWN STRL REUS W/TWL LRG LVL3 (GOWN DISPOSABLE)
GOWN STRL REUS W/TWL XL LVL3 (GOWN DISPOSABLE) ×2
KIT BASIN OR (CUSTOM PROCEDURE TRAY) ×3 IMPLANT
KIT ROOM TURNOVER OR (KITS) ×3 IMPLANT
LIQUID BAND (GAUZE/BANDAGES/DRESSINGS) ×3 IMPLANT
NEEDLE HYPO 22GX1.5 SAFETY (NEEDLE) ×3 IMPLANT
NEEDLE SPNL 22GX3.5 QUINCKE BK (NEEDLE) ×3 IMPLANT
NS IRRIG 1000ML POUR BTL (IV SOLUTION) ×3 IMPLANT
PACK LAMINECTOMY NEURO (CUSTOM PROCEDURE TRAY) ×3 IMPLANT
PAD ARMBOARD 7.5X6 YLW CONV (MISCELLANEOUS) ×9 IMPLANT
RUBBERBAND STERILE (MISCELLANEOUS) ×6 IMPLANT
SPONGE SURGIFOAM ABS GEL SZ50 (HEMOSTASIS) ×3 IMPLANT
STRIP CLOSURE SKIN 1/2X4 (GAUZE/BANDAGES/DRESSINGS) ×2 IMPLANT
SUT VIC AB 2-0 CT1 18 (SUTURE) ×3 IMPLANT
SUT VIC AB 3-0 SH 8-18 (SUTURE) ×3 IMPLANT
TOWEL OR 17X24 6PK STRL BLUE (TOWEL DISPOSABLE) ×3 IMPLANT
TOWEL OR 17X26 10 PK STRL BLUE (TOWEL DISPOSABLE) ×3 IMPLANT
WATER STERILE IRR 1000ML POUR (IV SOLUTION) ×3 IMPLANT

## 2015-11-30 NOTE — H&P (Signed)
Rhonda Park is an 75 y.o. female.   Chief Complaint: Right leg pain  HPI: 75 year old female with progressive lower back and right lower extremity pain consistent primarily with a right-sided L5 radiculopathy which is failed conservative management. Workup demonstrates a right-sided L4-5 broad-based disc herniation with marked compression of thecal sac and right L5 nerve root. Patient presents now for laminotomy and microdiscectomy in hopes of improving her symptoms.  Past Medical History  Diagnosis Date  . Cardiomegaly   . Cervicalgia   . Chronic airway obstruction, not elsewhere classified   . Unspecified congenital cystic kidney disease   . Unspecified hypothyroidism   . Unspecified essential hypertension     With evidence of hyperaldosteronism with severe hypokalemia. 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  Marland Kitchen Hyperlipidemia   . Coronary artery disease     Remote RCA stent. Most recent cardiac catheterization in May of 2010 showed an ejection fraction of 65% with 50% ostial left main stenosis and 90% distal LAD stenosis close to the apex  . Coronary atherosclerosis of unspecified type of vessel, native or graft   . Shortness of breath     with exertion  . Anxiety   . GERD (gastroesophageal reflux disease)   . Headache(784.0)   . Arthritis   . Anemia   . Type II or unspecified type diabetes mellitus without mention of complication, not stated as uncontrolled     Not on medications  . Stroke [facility]) 10/2013    memory loss  . UC (ulcerative colitis) (HCC)   . History of blood transfusion     30 units child birth    Past Surgical History  Procedure Laterality Date  . Tonsillectomy    . Hysterectomy    . Cholecystectomy    . Back surgery    . Appendectomy    . Knee surgery Right     "growth"  . Posterior segment      unlisted procedure  . Cardiac catheter[facility]  03/2009    [facility]  . Cardiac catheterization  05/2007    [facility]  . Cardiac catheterization  05/2009   ''  . Cardiac catheterization  07/2005    ''  . Abdominal hysterectomy    . Anterior cervical decomp/discectomy fusion N/A 05/23/2014    Procedure: CERVICAL FIVE SIX ANTERIOR CERVICAL DECOMPRESSION/DISCECTOMY FUSION 1 LEVEL;  Surgeon: Temple Pacini, MD;  Location: [facility] NEURO ORS;  Service: Neurosurgery;  Laterality: N/A;  . Breast cyst excision Left     3    Family History  Problem Relation Age of Onset  . Heart attack Mother   . Diabetes Mother   . Diabetes Maternal Grandmother   . Diabetes Maternal Grandfather   . Diabetes Sister   . Diabetes Brother   . Hypertension Mother   . Hypertension Father   . Hypertension Sister   . Hypertension Brother   . Hypertension Son   . Stroke Mother   . Stroke Son    Social History:  reports that she has been smoking Cigarettes.  She has been smoking about 0.02 packs per day. She does not have any smokeless tobacco history on file. She reports that she does not drink alcohol or use illicit drugs.  Allergies:  Allergies  Allergen Reactions  . Demerol [Meperidine] Other (See Comments)    Other Reaction: CNS Disorder  . Codeine     Reports that she gets jittery and does not feel right able to tolerate hydrocodone  . Ivp Dye [Iod[employer]] Hives  . Shellfish Allergy Hives  . Statins Other (See Comments)    myalgia myalgia    Medications Prior to Admission  Medication Sig Dispense Refill  . amLODipine (NORVASC) 10 MG tablet Take 10 mg by mouth daily.    . Cholecalciferol (VITAMIN D-3) 5000 UNITS TABS Take 5,000 Units by mouth daily.     . clonazePAM (KLONOPIN) 1 MG tablet Take 1 mg by mouth 2 (two) times daily.    . clopidogrel (PLAVIX) 75 MG tablet Take 1 tablet (75 mg total) by mouth daily with breakfast. 90 tablet 3  . esomeprazole (NEXIUM) 40 MG capsule Take 40 mg by mouth daily before breakfast.    . ezetimibe (ZETIA) 10 MG tablet Take 10 mg by mouth daily.    . isosorbide mononitrate (IMDUR) 60 MG 24 hr tablet Take 1.5  tablets (90 mg total) by mouth daily. 90 tablet 6  . levothyroxine (SYNTHROID, LEVOTHROID) 100 MCG tablet Take 100 mcg by mouth daily before breakfast.    . metoprolol succinate (TOPROL-XL) 100 MG 24 hr tablet Take 50 mg by mouth daily. Take with or immediately following a meal.    . sertraline (ZOLOFT) 100 MG tablet Take 100 mg by mouth daily.    Marland Kitchen spironolactone (ALDACTONE) 25 MG tablet Take 25 mg by mouth 2 (two) times daily.     . folic acid (FOLVITE) 400 MCG tablet Take 400 mcg by mouth daily.      No results found for this or any previous visit (from the past 48 hour(s)). No results found.  Pertinent items noted in HPI and remainder of comprehensive ROS otherwise negative.  Blood pressure 148/79, pulse 63, temperature 97.5 F (36.4 C), temperature source Oral, resp. rate 20, height  (1.676 m), weight 83.462 kg (184 lb), SpO2 97 %.  The patient is awake and alert. She is oriented and appropriate. Her cranial nerve function is intact. Her speech is fluent. Judgment and insight are intact. Motor and sensory examination of her upper extremities normal. Motor examination of her lower extremities reveal significant weakness of her right-sided anterior tibialis and extensor hallucis longus crating out of 4 over 5. A shunt with pain with straight leg raising. Positive right L5 sensory loss. Reflexes normal active except Achilles reflex is absent bilaterally. Examination head ears eyes and throat unremarkable. Chest and abdomen are benign. Extremities are free from injury or deformity. 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 Plan right L4-5 laminotomy and microdiscectomy. Risks and benefits of been explained. Patient wishes to proceed.  Omarri Eich A 11/30/2015, 11:36 AM

## 2015-11-30 NOTE — Anesthesia Preprocedure Evaluation (Addendum)
Anesthesia Evaluation  Patient identified by MRN, date of birth, ID band Patient awake    Reviewed: Allergy & Precautions, NPO status , Patient's Chart, lab work & pertinent test results, reviewed documented beta blocker date and time   Airway Mallampati: II  TM Distance: >3 FB Neck ROM: Full    Dental  (+) Dental Advisory Given   Pulmonary COPD, Current Smoker,    breath sounds clear to auscultation       Cardiovascular hypertension, Pt. on medications and Pt. on home beta blockers + CAD, + Cardiac Stents and + Peripheral Vascular Disease   Rhythm:Regular Rate:Normal  Low risk myocardial perfusion scan with small ischemic defect. NL EF. Hx RCA stent and CAD   Neuro/Psych CVA    GI/Hepatic Neg liver ROS, GERD  ,  Endo/Other  diabetes, Type 2, Oral Hypoglycemic AgentsHypothyroidism   Renal/GU Renal disease     Musculoskeletal  (+) Arthritis ,   Abdominal   Peds  Hematology Plts 114K   Anesthesia Other Findings   Reproductive/Obstetrics                            Lab Results  Component Value Date   WBC 6.3 11/08/2015   HGB 12.2 11/08/2015   HCT 36.6 11/08/2015   MCV 92.4 11/08/2015   PLT 114* 11/08/2015   Lab Results  Component Value Date   CREATININE 1.11* 11/08/2015   BUN 16 11/08/2015   NA 139 11/08/2015   K 4.8 11/08/2015   CL 102 11/08/2015   CO2 26 11/08/2015   No results found for: INR, PROTIME  Anesthesia Physical Anesthesia Plan  ASA: III  Anesthesia Plan: General   Post-op Pain Management:    Induction: Intravenous  Airway Management Planned: Oral ETT  Additional Equipment:   Intra-op Plan:   Post-operative Plan: Extubation in OR  Informed Consent: I have reviewed the patients History and Physical, chart, labs and discussed the procedure including the risks, benefits and alternatives for the proposed anesthesia with the patient or authorized representative  who has indicated his/her understanding and acceptance.   Dental advisory given  Plan Discussed with: CRNA  Anesthesia Plan Comments:         Anesthesia Quick Evaluation

## 2015-11-30 NOTE — Op Note (Signed)
Date of procedure: 11/30/2015  Date of dictation: Same  Service: Neurosurgery  Preoperative diagnosis: Right L4-5 herniated nucleus pulposus with radiculopathy  Postoperative diagnosis: Same  Procedure Name: Right L4-5 laminotomy and microdiscectomy  Surgeon:Honore Wipperfurth A.Angelyne Terwilliger, M.D.  Asst. Surgeon: Yetta Barre  Anesthesia: General  Indication: 75 year old female with back and right lower extremity pain and paresthesias and weakness consistent with a right-sided L5 radiculopathy who has been found to have a large right-sided L4-5 disc herniation. Patient presents now for microdiscectomy  Operative note: After induction of anesthesia, patient positioned prone onto a Wilson frame and appropriate padded. Lumbar region prepped and draped. Incision made overlying L4-5. Dissection performed on the right. Retractor placed. X-ray taken. Level confirmed. Laminotomy performed using high-speed drill and Kerrison rongeurs. Underlying thecal sac and L5 nerve root identified. Microscope brought in the field using microdissection. Thecal sac and L5 nerve gently mobilized. Disc herniation and disc space incised with 15 blade. Discectomy performed using pituitary rongeurs up and the pituitary rongeurs and Epstein curettes. All elements of the disc herniation were resected. All loose or obvious degenerative disc material was removed from the interspace. At this point a very thorough discectomy had been achieved. There was no evidence of injury to the thecal sac and nerve roots. Wound is then irrigated with kanamycin solution. Gelfoam was placed topically for hemostasis. Microscope and retractor system were removed. Hemostasis muscle to watch Montez Morita. We then closed in layers. Steri-Strips sterile dressing were applied. No apparent complications. Patient tolerated the procedure well and she returns to the recovery room postoperatively.

## 2015-11-30 NOTE — Anesthesia Postprocedure Evaluation (Signed)
Anesthesia Post Note  Patient: Rhonda Park  Procedure(s) Performed: Procedure(s) (LRB): LUMBAR LAMINECTOMY/DECOMPRESSION MICRODISCECTOMY 1 LEVEL (Right)  Patient location during evaluation: PACU Anesthesia Type: General Level of consciousness: awake and alert Pain management: pain level controlled Vital Signs Assessment: post-procedure vital signs reviewed and stable Respiratory status: spontaneous breathing Cardiovascular status: blood pressure returned to baseline Anesthetic complications: no    Last Vitals:  Filed Vitals:   11/30/15 1530 11/30/15 1539  BP:  128/69  Pulse: 60 63  Temp:  36.3 C  Resp: 24 19    Last Pain:  Filed Vitals:   11/30/15 1540  PainSc: Asleep                 Kennieth Rad

## 2015-11-30 NOTE — Brief Op Note (Signed)
11/30/2015  1:29 PM  PATIENT:  Rhonda Park  74 y.o. female  PRE-OPERATIVE DIAGNOSIS:  HNP  POST-OPERATIVE DIAGNOSIS:  HNP  PROCEDURE:  Procedure(s): LUMBAR LAMINECTOMY/DECOMPRESSION MICRODISCECTOMY 1 LEVEL (Right)  SURGEON:  Surgeon(s) and Role:    * Julio Sicks, MD - Primary    * Tia Alert, MD - Assisting  PHYSICIAN ASSISTANT:   ASSISTANTS:    ANESTHESIA:   general  EBL:  Total I/O In: 1200 [I.V.:1200] Out: 100 [Blood:100]  BLOOD ADMINISTERED:none  DRAINS: none   LOCAL MEDICATIONS USED:  MARCAINE     SPECIMEN:  No Specimen  DISPOSITION OF SPECIMEN:  N/A  COUNTS:  YES  TOURNIQUET:  * No tourniquets in log *  DICTATION: .Dragon Dictation  PLAN OF CARE: Admit for overnight observation  PATIENT DISPOSITION:  PACU - hemodynamically stable.   Delay start of Pharmacological VTE agent (>24hrs) due to surgical blood loss or risk of bleeding: yes

## 2015-11-30 NOTE — Anesthesia Procedure Notes (Signed)
Procedure Name: Intubation Date/Time: 11/30/2015 12:13 PM Performed by: Rejeana Brock L Pre-anesthesia Checklist: Emergency Drugs available, Patient identified, Suction available, Patient being monitored and Timeout performed Patient Re-evaluated:Patient Re-evaluated prior to inductionOxygen Delivery Method: Circle system utilized Preoxygenation: Pre-oxygenation with 100% oxygen Intubation Type: IV induction Ventilation: Mask ventilation without difficulty Laryngoscope Size: Miller and 2 (attempted intubation with MAC 3 blade with grade 4 view, second  attempt with Miller 2 blade and grade 1 view) Grade View: Grade I Tube type: Oral Tube size: 7.5 mm Number of attempts: 2 Airway Equipment and Method: Stylet and LTA kit utilized Placement Confirmation: ETT inserted through vocal cords under direct vision,  positive ETCO2 and breath sounds checked- equal and bilateral Secured at: 22 cm Tube secured with: Tape Dental Injury: Teeth and Oropharynx as per pre-operative assessment

## 2015-11-30 NOTE — Transfer of Care (Signed)
Immediate Anesthesia Transfer of Care Note  Patient: Rhonda Park  Procedure(s) Performed: Procedure(s): LUMBAR LAMINECTOMY/DECOMPRESSION MICRODISCECTOMY 1 LEVEL (Right)  Patient Location: PACU  Anesthesia Type:General  Level of Consciousness: awake and alert   Airway & Oxygen Therapy: Patient Spontanous Breathing and Patient connected to nasal cannula oxygen  Post-op Assessment: Report given to RN, Post -op Vital signs reviewed and stable and Patient moving all extremities X 4  Post vital signs: stable  Last Vitals:  Filed Vitals:   11/30/15 1017  BP: 148/79  Pulse: 63  Temp: 36.4 C  Resp: 20    Complications: No apparent anesthesia complications

## 2015-12-01 DIAGNOSIS — Z9889 Other specified postprocedural states: Secondary | ICD-10-CM

## 2015-12-01 LAB — GLUCOSE, CAPILLARY: Glucose-Capillary: 115 mg/dL — ABNORMAL HIGH (ref 65–99)

## 2015-12-01 MED ORDER — HYDROCODONE-ACETAMINOPHEN 5-325 MG PO TABS: 1.0000 | ORAL_TABLET | Freq: Four times a day (QID) | ORAL | Status: DC | PRN

## 2015-12-01 NOTE — Discharge Summary (Signed)
Physician Discharge Summary  Patient ID: Rhonda Park MRN: [id number] DOB/AGE: [birth year] 73 y.o.  Admit date: 11/30/2015 Discharge date: 12/01/2015  Admission Diagnoses: HNP    Discharge Diagnoses: same   Discharged Condition: good  Hospital Course: The patient was admitted on 11/30/2015 and taken to the operating room where the patient underwent LL. The patient tolerated the procedure well and was taken to the recovery room and then to the floor in stable condition. The hospital course was routine. There were no complications. The wound remained clean dry and intact. Pt had appropriate back soreness. No complaints of leg pain or new N/T/W. The patient remained afebrile with stable vital signs, and tolerated a regular diet. The patient continued to increase activities, and pain was well controlled with oral pain medications.   Consults: None  Significant Diagnostic Studies:  Results for orders placed or performed during the hospital encounter of 11/30/15  CBC  Result Value Ref Range   WBC 6.0 4.0 - 10.5 K/uL   RBC 3.89 3.87 - 5.11 MIL/uL   Hemoglobin 11.8 (L) 12.0 - 15.0 g/dL   HCT 40.9 81.1 - 91.4 %   MCV 92.8 78.0 - 100.0 fL   MCH 30.3 26.0 - 34.0 pg   MCHC 32.7 30.0 - 36.0 g/dL   RDW 78.2 95.6 - 21.3 %   Platelets 66 (L) 150 - 400 K/uL  Basic metabolic panel  Result Value Ref Range   Sodium 143 135 - 145 mmol/L   Potassium 4.8 3.5 - 5.1 mmol/L   Chloride 108 101 - 111 mmol/L   CO2 23 22 - 32 mmol/L   Glucose, Bld 103 (H) 65 - 99 mg/dL   BUN 22 (H) 6 - 20 mg/dL   Creatinine, Ser 0.86 (H) 0.44 - 1.00 mg/dL   Calcium 9.5 8.9 - 57.8 mg/dL   GFR calc non Af Amer 52 (L) >60 mL/min   GFR calc Af Amer 60 (L) >60 mL/min   Anion gap 12 5 - 15  Glucose, capillary  Result Value Ref Range   Glucose-Capillary 159 (H) 65 - 99 mg/dL   Comment 1 Notify RN    Comment 2 Document in Chart   Glucose, capillary  Result Value Ref Range   Glucose-Capillary 246 (H) 65 - 99 mg/dL   Comment 1 Notify RN    Comment 2 Document in Chart   Glucose, capillary  Result Value Ref Range   Glucose-Capillary 115 (H) 65 - 99 mg/dL   Comment 1 Notify RN    Comment 2 Document in Chart     Dg Lumbar Spine 1 View  11/30/2015  CLINICAL DATA:  Right L4-5 laminectomy. EXAM: LUMBAR SPINE - 1 VIEW COMPARISON:  MRI of the lumbosacral spine 10/15/2015 FINDINGS: Single lateral radiograph of the spine demonstrates tissue retractor within the posterior soft tissues. Metallic instrument is seen at the level of L5-S1 disc space. Moderate in severity multilevel osteoarthritic changes are noted. IMPRESSION: Intraoperative radiographs with metallic localizer at the level of L5-S1 disc space. Electronically Signed   By: Ted Mcalpine M.D.   On: 11/30/2015 15:08    Antibiotics:  Anti-infectives    Start     Dose/Rate Route Frequency Ordered Stop   11/30/15 2000  ceFAZolin (ANCEF) IVPB 1 g/50 mL premix     1 g 100 mL/hr over 30 Minutes Intravenous Every 8 hours 11/30/15 1623 12/01/15 0338   11/30/15 1234  bacitracin 50,000 Units in sodium chloride irrigation 0.9 % 500 mL irrigation  Status:  Discontinued       As needed 11/30/15 1234 11/30/15 1333   11/30/15 0600  ceFAZolin (ANCEF) IVPB 2 g/50 mL premix     2 g 100 mL/hr over 30 Minutes Intravenous On call to O.R. 11/29/15 1343 11/30/15 1217      Discharge Exam: Blood pressure 131/54, pulse 63, temperature 98.1 F (36.7 C), temperature source Oral, resp. rate 18, height  (1.676 m), weight 83.462 kg (184 lb), SpO2 95 %. Neurologic: Grossly normal Dressing dry  Discharge Medications:     Medication List    TAKE these medications        amLODipine 10 MG tablet  Commonly known as:  NORVASC  Take 10 mg by mouth daily.     clonazePAM 1 MG tablet  Commonly known as:  KLONOPIN  Take 1 mg by mouth 2 (two) times daily.     clopidogrel 75 MG tablet  Commonly known as:  PLAVIX  Take 1 tablet (75 mg total) by mouth daily with  breakfast.     esomeprazole 40 MG capsule  Commonly known as:  NEXIUM  Take 40 mg by mouth daily before breakfast.     ezetimibe 10 MG tablet  Commonly known as:  ZETIA  Take 10 mg by mouth daily.     folic acid 400 MCG tablet  Commonly known as:  FOLVITE  Take 400 mcg by mouth daily.     HYDROcodone-acetaminophen 5-325 MG tablet  Commonly known as:  NORCO/VICODIN  Take 1 tablet by mouth every 6 (six) hours as needed for moderate pain (mild pain).     isosorbide mononitrate 60 MG 24 hr tablet  Commonly known as:  IMDUR  Take 1.5 tablets (90 mg total) by mouth daily.     levothyroxine 100 MCG tablet  Commonly known as:  SYNTHROID, LEVOTHROID  Take 100 mcg by mouth daily before breakfast.     metoprolol succinate 100 MG 24 hr tablet  Commonly known as:  TOPROL-XL  Take 50 mg by mouth daily. Take with or immediately following a meal.     sertraline 100 MG tablet  Commonly known as:  ZOLOFT  Take 100 mg by mouth daily.     spironolactone 25 MG tablet  Commonly known as:  ALDACTONE  Take 25 mg by mouth 2 (two) times daily.     Vitamin D-3 5000 UNITS Tabs  Take 5,000 Units by mouth daily.        Disposition: home   Final Dx: microdiskectomy      Discharge Instructions     Remove dressing in 72 hours    Complete by:  As directed      Call MD for:  difficulty breathing, headache or visual disturbances    Complete by:  As directed      Call MD for:  hives    Complete by:  As directed      Call MD for:  persistant nausea and vomiting    Complete by:  As directed      Call MD for:  redness, tenderness, or signs of infection (pain, swelling, redness, odor or green/yellow discharge around incision site)    Complete by:  As directed      Call MD for:  severe uncontrolled pain    Complete by:  As directed      Call MD for:  temperature >100.4    Complete by:  As directed      Diet - low sodium heart  healthy    Complete by:  As directed      Discharge instructions     Complete by:  As directed   May shower, no heavy lifting, no bending or twisting     Increase activity slowly    Complete by:  As directed               Signed: Homer Miller S 12/01/2015, 9:07 AM

## 2015-12-01 NOTE — Progress Notes (Signed)
Patient alert and oriented, mae's well, voiding adequate amount of urine, swallowing without difficulty, no c/o pain. Patient discharged home with family. Script and discharged instructions given to patient. Patient and family stated understanding of d/c instructions given and has an appointment with MD. 

## 2015-12-03 ENCOUNTER — Ambulatory Visit: Payer: Self-pay | Admitting: Cardiovascular Disease

## 2015-12-03 ENCOUNTER — Encounter (HOSPITAL_COMMUNITY): Payer: Self-pay | Admitting: Neurosurgery

## 2015-12-03 LAB — GLUCOSE, CAPILLARY: Glucose-Capillary: 107 mg/dL — ABNORMAL HIGH (ref 65–99)

## 2015-12-12 ENCOUNTER — Ambulatory Visit: Payer: Self-pay | Admitting: Nurse Practitioner

## 2015-12-25 ENCOUNTER — Ambulatory Visit: Payer: Self-pay | Admitting: Nurse Practitioner

## 2015-12-28 ENCOUNTER — Ambulatory Visit: Payer: Self-pay | Admitting: Cardiovascular Disease

## 2016-01-10 ENCOUNTER — Encounter: Payer: Self-pay | Admitting: *Deleted

## 2016-01-10 ENCOUNTER — Emergency Department: Payer: Medicare Other

## 2016-01-10 ENCOUNTER — Emergency Department
Admission: EM | Admit: 2016-01-10 | Discharge: 2016-01-10 | Disposition: A | Discharge status: Home / Self Care | Payer: Medicare Other | Attending: Emergency Medicine | Admitting: Emergency Medicine

## 2016-01-10 DIAGNOSIS — I10 Essential (primary) hypertension: Secondary | ICD-10-CM | POA: Insufficient documentation

## 2016-01-10 DIAGNOSIS — Y9289 Other specified places as the place of occurrence of the external cause: Secondary | ICD-10-CM | POA: Diagnosis not present

## 2016-01-10 DIAGNOSIS — S3992XA Unspecified injury of lower back, initial encounter: Secondary | ICD-10-CM | POA: Diagnosis not present

## 2016-01-10 DIAGNOSIS — Y9389 Activity, other specified: Secondary | ICD-10-CM | POA: Insufficient documentation

## 2016-01-10 DIAGNOSIS — W01198A Fall on same level from slipping, tripping and stumbling with subsequent striking against other object, initial encounter: Secondary | ICD-10-CM | POA: Diagnosis not present

## 2016-01-10 DIAGNOSIS — Z79899 Other long term (current) drug therapy: Secondary | ICD-10-CM | POA: Insufficient documentation

## 2016-01-10 DIAGNOSIS — Z7902 Long term (current) use of antithrombotics/antiplatelets: Secondary | ICD-10-CM | POA: Insufficient documentation

## 2016-01-10 DIAGNOSIS — Y998 Other external cause status: Secondary | ICD-10-CM | POA: Insufficient documentation

## 2016-01-10 DIAGNOSIS — S0990XA Unspecified injury of head, initial encounter: Secondary | ICD-10-CM | POA: Insufficient documentation

## 2016-01-10 DIAGNOSIS — F17210 Nicotine dependence, cigarettes, uncomplicated: Secondary | ICD-10-CM | POA: Insufficient documentation

## 2016-01-10 DIAGNOSIS — S79911A Unspecified injury of right hip, initial encounter: Secondary | ICD-10-CM | POA: Insufficient documentation

## 2016-01-10 DIAGNOSIS — E119 Type 2 diabetes mellitus without complications: Secondary | ICD-10-CM | POA: Insufficient documentation

## 2016-01-10 DIAGNOSIS — W19XXXA Unspecified fall, initial encounter: Secondary | ICD-10-CM

## 2016-01-10 NOTE — ED Provider Notes (Signed)
----------------------------------------- 11:50 PM on 01/10/2016 -----------------------------------------   Blood pressure 167/71, pulse 63, temperature 97.7 F (36.5 C), temperature source Oral, resp. rate 18, height  (1.676 m), weight 175 lb (79.379 kg), SpO2 97 %.  Assuming care from Dr. Darnelle Catalan.  In short, Rhonda Park is a 75 y.o. female with a chief complaint of Fall and Head Injury .  Refer to the original H&P for additional details.  The current plan of care is to *following the results of the x-ray findings and CAT scan CT Head Wo Contrast (Final result) Result time: 01/10/16 20:37:39   Final result by Rad Results In Interface (01/10/16 20:37:39)   Narrative:   CLINICAL DATA: Pt to triage via wheelchair. Pt states she fell yesterday and struck her head on tile floor. Pt has a headache and nausea. Bruise to chin. Pt had spinal surgery 1 month ago. Pt also has a bruise to right upper arm. Pt is on plavix. Pt alert. Speech clear.  EXAM: CT HEAD WITHOUT CONTRAST  TECHNIQUE: Contiguous axial images were obtained from the base of the skull through the vertex without intravenous contrast.  COMPARISON: 10/25/2014  FINDINGS: Ventricles are normal in size, for this patient's age, and normal in configuration.  There are no parenchymal masses or mass effect. 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 There is no evidence of a recent cortical infarct. There are no extra-axial masses or abnormal fluid collections.  Narrative:   CLINICAL DATA: 75 year old female with fall and trauma to the head.  EXAM: CT CERVICAL SPINE WITHOUT CONTRAST  TECHNIQUE: Multidetector CT imaging of the cervical spine was performed without intravenous contrast. Multiplanar CT image reconstructions were also generated.  COMPARISON: CT dated 01/10/2016 and 10/25/2014  FINDINGS: There is no acute fracture or subluxation of the  cervical spine.There multilevel degenerative changes. C5-C6 disc spacer and anterior fusion plate and screws.The odontoid and spinous processes are intact.There is normal anatomic alignment of the C1-C2 lateral masses. The visualized soft tissues appear unremarkable.  IMPRESSION: EXAM: LUMBAR SPINE - COMPLETE 4+ VIEW  COMPARISON: MRI of the lumbar spine performed 10/15/2015  FINDINGS: There is no evidence of fracture or subluxation. Vacuum phenomenon is noted along the mid lumbar spine, with associated disc space narrowing. Scattered anterior and lateral osteophytes are seen along the lumbar spine. There is mild right convex lumbar scoliosis. The bony foramina are not well assessed. Mild underlying facet disease is seen.  The visualized bowel gas pattern is unremarkable in appearance; air and stool are noted within the colon. The sacroiliac joints are within normal limits. Scattered vascular calcifications are noted.  IMPRESSION: 1. No evidence of fracture or subluxation along the lumbar spine. 2. Mild degenerative change again noted along the lumbar spine, with mild right convex thoracic scoliosis.   Electronically Signed By: Roanna Raider M.D. On: 01/10/2016 23:25          DG HIP UNILAT WITH PELVIS 2-3 VIEWS RIGHT (Final result) Result time: 01/10/16 23:24:15   Final result by Rad Results In Interface (01/10/16 23:24:15)   Narrative:   CLINICAL DATA: 75 year old female with fall and right hip pain.  EXAM: DG HIP (WITH OR WITHOUT PELVIS) 2-3V RIGHT  COMPARISON: None.  FINDINGS: There is no acute fracture or dislocation. There is mild osteopenia. There is osteoarthritic changes of the right hip. The soft tissues are unremarkable.  IMPRESSION: No acute fracture or dislocation.     No acute/traumatic cervical spine pathology.  There is no intracranial hemorrhage.  No skull fracture. The visualized sinuses and mastoid air cells  are clear.  IMPRESSION: 1. No acute intracranial abnormalities. 2. 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    Patient and her husband were informed of the negative results and she'll be discharged to home. Appropriate discharge paperwork was filled out by Dr. Franchot Gallo, MD 01/10/16 2352

## 2016-01-10 NOTE — ED Notes (Signed)
Pt transported to CT ?

## 2016-01-10 NOTE — ED Provider Notes (Signed)
[facility] Emergency Department Provider Note  ____________________________________________  Time seen: Approximately 10:48 PM  I have reviewed the triage vital signs and the nursing notes.   HISTORY  Chief Complaint Fall and Head Injury    HPI Rhonda Park is a 75 y.o. female patient patient had surgery for spinal stenosis recently. She's been falling since however today she fell and hit her head. She complains of some pain in her head she also hit her chin. She has a little bit of low back pain and right hip pain after the fall as well. Patient did not lose consciousness. She is not currently nauseated vomiting or having any visual problems   Past Medical History  Diagnosis Date  . Cardiomegaly   . Cervicalgia   . Chronic airway obstruction, not elsewhere classified   . Unspecified congenital cystic kidney disease   . Unspecified hypothyroidism   . Unspecified essential hypertension     With evidence of hyperaldosteronism with severe hypokalemia. 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  Marland Kitchen Hyperlipidemia   . Coronary artery disease     Remote RCA stent. Most recent cardiac catheterization in May of 2010 showed an ejection fraction of 65% with 50% ostial left main stenosis and 90% distal LAD stenosis close to the apex  . Coronary atherosclerosis of unspecified type of vessel, native or graft   . Shortness of breath     with exertion  . Anxiety   . GERD (gastroesophageal reflux disease)   . Headache(784.0)   . Arthritis   . Anemia   . Type II or unspecified type diabetes mellitus without mention of complication, not stated as uncontrolled     Not on medications  . Stroke [facility]) 10/2013    memory loss  . UC (ulcerative colitis) (HCC)   . History of blood transfusion     30 units child birth    Patient Active Problem List   Diagnosis Date Noted  . S/P lumbar laminectomy 12/01/2015  . 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 Spinal stenosis in cervical region 05/23/2014  . 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 Abdominal aortic aneurysm (HCC) 10/27/2013  . Coronary artery disease   . Essential hypertension   . Hyperlipidemia     Past Surgical History  Procedure Laterality Date  . Tonsillectomy    . Hysterectomy    . Cholecystectomy    . Back surgery    . Appendectomy    . Knee surgery Right     "growth"  . Posterior segment      unlisted procedure  . Cardiac catheter[facility]  03/2009    [facility]  . Cardiac catheterization  05/2007    [facility]  . Cardiac catheterization  05/2009    ''  . Cardiac catheterization  07/2005    ''  . Abdominal hysterectomy    . Anterior cervical decomp/discectomy fusion N/A 05/23/2014    Procedure: CERVICAL FIVE SIX ANTERIOR CERVICAL DECOMPRESSION/DISCECTOMY FUSION 1 LEVEL;  Surgeon: Temple Pacini, MD;  Location: [facility];  Service: Neurosurgery;  Laterality: N/A;  . Breast cyst excision Left     3  . Lumbar laminectomy/decompression microdiscectomy Right 11/30/2015    Procedure: LUMBAR LAMINECTOMY/DECOMPRESSION MICRODISCECTOMY 1 LEVEL;  Surgeon: Julio Sicks, MD;  Location: [facility];  Service: Neurosurgery;  Laterality: Right;    Current Outpatient Rx  Name  Route  Sig  Dispense  Refill  . amLODipine (NORVASC) 10 MG tablet   Oral  Take 10 mg by mouth daily.         . Cholecalciferol (VITAMIN D-3) 5000 UNITS TABS   Oral   Take 5,000 Units by mouth daily.          . clonazePAM (KLONOPIN) 1 MG tablet   Oral   Take 1 mg by mouth 2 (two) times daily.         . clopidogrel (PLAVIX) 75 MG tablet   Oral   Take 1 tablet (75 mg total) by mouth daily with breakfast.   90 tablet   3     Pt would like 90 day supply.   Marland Kitchen esomeprazole (NEXIUM) 40 MG capsule   Oral   Take 40 mg by mouth daily before breakfast.         . ezetimibe (ZETIA) 10 MG tablet   Oral   Take 10 mg by mouth daily.         . folic acid (FOLVITE) 400 MCG tablet    Oral   Take 400 mcg by mouth daily.         Marland Kitchen HYDROcodone-acetaminophen (NORCO/VICODIN) 5-325 MG tablet   Oral   Take 1 tablet by mouth every 6 (six) hours as needed for moderate pain (mild pain).   50 tablet   0   . isosorbide mononitrate (IMDUR) 60 MG 24 hr tablet   Oral   Take 1.5 tablets (90 mg total) by mouth daily.   90 tablet   6   . levothyroxine (SYNTHROID, LEVOTHROID) 100 MCG tablet   Oral   Take 100 mcg by mouth daily before breakfast.         . metoprolol succinate (TOPROL-XL) 100 MG 24 hr tablet   Oral   Take 50 mg by mouth daily. Take with or immediately following a meal.         . sertraline (ZOLOFT) 100 MG tablet   Oral   Take 100 mg by mouth daily.         Marland Kitchen spironolactone (ALDACTONE) 25 MG tablet   Oral   Take 25 mg by mouth 2 (two) times daily.            Allergies Demerol; Codeine; Ivp dye; Shellfish allergy; and Statins  Family History  Problem Relation Age of Onset  . Heart attack Mother   . Diabetes Mother   . Diabetes Maternal Grandmother   . Diabetes Maternal Grandfather   . Diabetes Sister   . Diabetes Brother   . Hypertension Mother   . Hypertension Father   . Hypertension Sister   . Hypertension Brother   . Hypertension Son   . Stroke Mother   . Stroke Son     Social History Social History  Substance Use Topics  . Smoking status: Current Every Day Smoker -- 0.02 packs/day    Types: Cigarettes  . Smokeless tobacco: None  . Alcohol Use: No    Review of Systems Constitutional: No fever/chills Eyes: No visual changes. ENT: No sore throat. Cardiovascular: Denies chest pain. Respiratory: Denies shortness of breath. Gastrointestinal: No abdominal pain.  No nausea, no vomiting.  No diarrhea.  No constipation. Genitourinary: Negative for dysuria. Musculoskeletal: Negative for back pain. Skin: Negative for rash.   10-point ROS otherwise negative.  ____________________________________________   PHYSICAL  EXAM:  VITAL SIGNS: ED Triage Vitals  Enc Vitals Group     BP 01/10/16 2013 139/52 mmHg     Pulse Rate 01/10/16 2013 66     Resp 01/10/16  2013 20     Temp 01/10/16 2013 98.6 F (37 C)     Temp Source 01/10/16 2013 Oral     SpO2 01/10/16 2013 98 %     Weight 01/10/16 2013 175 lb (79.379 kg)     Height 01/10/16 2013  (1.676 m)     Head Cir --      Peak Flow --      Pain Score 01/10/16 2015 5     Pain Loc --      Pain Edu? --      Excl. in GC? --    Constitutional: Alert and oriented. Well appearing and in no acute distress. Eyes: Conjunctivae are normal. PERRL. EOMI. Head: Some tenderness posteriorly Nose: No congestion/rhinnorhea. Mouth/Throat: Mucous membranes are moist.  Oropharynx non-erythematous. Neck: No stridor Cardiovascular: Normal rate, regular rhythm. Grossly normal heart sounds.  Good peripheral circulation. Respiratory: Normal respiratory effort.  No retractions. Lungs CTAB. Gastrointestinal: Soft and nontender. No distention. No abdominal bruits. No CVA tenderness. Musculoskeletal: Some tenderness in the right hip. There is also a little bit of tenderness in the L-spine. Where the surgical scar is well-healed No joint effusions. Neurologic:  Normal speech and language. No gross focal neurologic deficits are appreciated. No gait instability. Skin:  Skin is warm, dry and intact. No rash noted. Psychiatric: Mood and affect are normal. Speech and behavior are normal.  ____________________________________________   LABS (all labs ordered are listed, but only abnormal results are displayed)  Labs Reviewed - No data to display ____________________________________________  EKG  _____________________________________  RADIOLOGY   ____________________________________________   PROCEDURES   ____________________________________________   INITIAL IMPRESSION / ASSESSMENT AND PLAN / ED COURSE  Pertinent labs & imaging results that were available during  my care of the patient were reviewed by me and considered in my medical decision making (see chart for details).  Dr. Huel Cote will check on x-ray results and if negative discharge ____________________________________________   FINAL CLINICAL IMPRESSION(S) / ED DIAGNOSES  Final diagnoses:  Fall, initial encounter      Arnaldo Natal, MD 01/10/16 2334

## 2016-01-10 NOTE — ED Notes (Signed)
Pt to triage via wheelchair.  Pt states she fell yesterday and struck her head on tile floor.  Pt has a headache and nausea.  Bruise to chin.  Pt had spinal surgery 1 month ago.  Pt also has a bruise to right upper arm.  Pt is on plavix.  Pt alert.  Speech clear.

## 2016-02-12 ENCOUNTER — Ambulatory Visit: Payer: Self-pay | Admitting: Cardiovascular Disease

## 2016-02-13 ENCOUNTER — Encounter: Payer: Self-pay | Admitting: Cardiovascular Disease

## 2016-06-09 ENCOUNTER — Ambulatory Visit (INDEPENDENT_AMBULATORY_CARE_PROVIDER_SITE_OTHER): Payer: Medicare Other | Admitting: Cardiovascular Disease

## 2016-06-09 ENCOUNTER — Encounter: Payer: Self-pay | Admitting: Cardiovascular Disease

## 2016-06-09 VITALS — BP 156/90 | HR 63 | Ht 66.0 in | Wt 182.1 lb

## 2016-06-09 DIAGNOSIS — E785 Hyperlipidemia, unspecified: Secondary | ICD-10-CM | POA: Diagnosis not present

## 2016-06-09 DIAGNOSIS — I2510 Atherosclerotic heart disease of native coronary artery without angina pectoris: Secondary | ICD-10-CM | POA: Diagnosis not present

## 2016-06-09 NOTE — Patient Instructions (Signed)
Medication Instructions: Continue same medications.   Labwork: None.   Procedures/Testing: None.   Follow-Up: 6 months with Dr. Arida.   Any Additional Special Instructions Will Be Listed Below (If Applicable).     If you need a refill on your cardiac medications before your next appointment, please call your pharmacy.   

## 2016-06-09 NOTE — Progress Notes (Signed)
Cardiology Office Note   Date:  06/09/2016   ID:  Rhonda Park, DOB [birth year], MRN [id number]  PCP:  Evelene Croon, MD  Cardiologist:   Lorine Bears, MD   Chief Complaint  Patient presents with  . Follow-up    back pain. no other complaints.      History of Present Illness: Rhonda Park is a 75 y.o. female who presents for a followup visit. She has known history of coronary artery disease with previous stenting of the right coronary artery. Most recent cardiac catheterization in 2010 showed moderate ostial left main stenosis with 90% distal LAD stenosis close to the apex and mild instent restenosis in the right coronary artery. She has known history of refractory hypertension with previous evidence of hyperaldosteronism with severe hypokalemia. Imaging was suggestive of adrenal adenoma. She has been treated successfully with spironolactone. She presented in 10/2013 to [facility] with headache and hypertensive urgency. 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  Most recent echocardiogram in January 2017 showed normal LV systolic function with mild aortic regurgitation. She underwent a pharmacologic nuclear stress test in January 2017 which showed subtle anterolateral ischemia with normal ejection fraction. The study was overall low risk. It was done before back surgery. She informed me that she underwent 3 back Surgeries. She continues to have weakness in her legs and numbness. She denies any chest pain or change in dyspnea.   Past Medical History:  Diagnosis Date  . Anemia   . Anxiety   . Arthritis   . Cardiomegaly   . Cervicalgia   . Chronic airway obstruction, not elsewhere classified   . Coronary artery disease    Remote RCA stent. Most recent cardiac catheterization in May of 2010 showed an ejection fraction of 65% with 50% ostial left main stenosis and 90% distal LAD stenosis close to the apex  . Coronary atherosclerosis of unspecified type of  vessel, native or graft   . GERD (gastroesophageal reflux disease)   . Headache(784.0)   . History of blood transfusion    30 units child birth  . Hyperlipidemia   . Shortness of breath    with exertion  . Stroke [facility]) 10/2013   memory loss  . Type II or unspecified type diabetes mellitus without mention of complication, not stated as uncontrolled    Not on medications  . UC (ulcerative colitis) (HCC)   . Unspecified congenital cystic kidney disease   . Unspecified essential hypertension    With evidence of hyperaldosteronism with severe hypokalemia. 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  Marland Kitchen Unspecified hypothyroidism     Past Surgical History:  Procedure Laterality Date  . ABDOMINAL HYSTERECTOMY    . ANTERIOR CERVICAL DECOMP/DISCECTOMY FUSION N/A 05/23/2014   Procedure: CERVICAL FIVE SIX ANTERIOR CERVICAL DECOMPRESSION/DISCECTOMY FUSION 1 LEVEL;  Surgeon: Temple Pacini, MD;  Location: [facility] NEURO ORS;  Service: Neurosurgery;  Laterality: N/A;  . APPENDECTOMY    . BACK SURGERY    . BREAST CYST EXCISION Left    3  . CARDIAC CATHETERIZATION  03/2009   [facility]  . CARDIAC CATHETERIZATION  05/2007   [facility]  . CARDIAC CATHETERIZATION  05/2009   ''  . CARDIAC CATHETERIZATION  07/2005   ''  . CHOLECYSTECTOMY    . hysterectomy    . KNEE SURGERY Right    "growth"  . LUMBAR LAMINECTOMY/DECOMPRESSION MICRODISCECTOMY Right 11/30/2015   Procedure: LUMBAR LAMINECTOMY/DECOMPRESSION MICRODISCECTOMY 1 LEVEL;  Surgeon: Julio Sicks, MD;  Location: [facility]  NEURO ORS;  Service: Neurosurgery;  Laterality: Right;  . posterior segment     unlisted procedure  . TONSILLECTOMY       Current Outpatient Prescriptions  Medication Sig Dispense Refill  . amLODipine (NORVASC) 10 MG tablet Take 10 mg by mouth daily.    . Cholecalciferol (VITAMIN D-3) 5000 UNITS TABS Take 5,000 Units by mouth daily.     . clonazePAM (KLONOPIN) 1 MG tablet Take 1 mg by mouth 2 (two) times daily.    . clopidogrel (PLAVIX) 75 MG  tablet Take 1 tablet (75 mg total) by mouth daily with breakfast. 90 tablet 3  . esomeprazole (NEXIUM) 40 MG capsule Take 40 mg by mouth daily before breakfast.    . ezetimibe (ZETIA) 10 MG tablet Take 10 mg by mouth daily.    . folic acid (FOLVITE) 400 MCG tablet Take 400 mcg by mouth daily.    Marland Kitchen HYDROcodone-acetaminophen (NORCO/VICODIN) 5-325 MG tablet Take 1 tablet by mouth every 6 (six) hours as needed for moderate pain (mild pain). 50 tablet 0  . isosorbide mononitrate (IMDUR) 60 MG 24 hr tablet Take 1.5 tablets (90 mg total) by mouth daily. 90 tablet 6  . levothyroxine (SYNTHROID, LEVOTHROID) 100 MCG tablet Take 100 mcg by mouth daily before breakfast.    . metoprolol succinate (TOPROL-XL) 100 MG 24 hr tablet Take 50 mg by mouth daily. Take with or immediately following a meal.    . sertraline (ZOLOFT) 100 MG tablet Take 100 mg by mouth daily.    Marland Kitchen spironolactone (ALDACTONE) 25 MG tablet Take 25 mg by mouth 2 (two) times daily.      No current facility-administered medications for this visit.     Allergies:   Demerol [meperidine]; Codeine; Ivp dye [iodinated diagnostic agents]; Shellfish allergy; and Statins    Social History:  The patient  reports that she has been smoking Cigarettes.  She has been smoking about 0.02 packs per day. She does not have any smokeless tobacco history on file. She reports that she does not drink alcohol or use drugs.   Family History:  The patient's family history includes Diabetes in her brother, maternal grandfather, maternal grandmother, mother, and sister; Heart attack in her mother; Hypertension in her brother, father, mother, sister, and son; Stroke in her mother and son.    ROS:  Please see the history of present illness.   Otherwise, review of systems are positive for none.   All other systems are reviewed and negative.    PHYSICAL EXAM: VS:  BP (!) 156/90 (BP Location: Left Arm, Patient Position: Sitting, Cuff Size: Large)   Pulse 63   Ht 5\' 6"   (1.676 m)   Wt 182 lb 1.9 oz (82.6 kg)   BMI 29.39 kg/m  , BMI Body mass index is 29.39 kg/m. GEN: Well nourished, well developed, in no acute distress  HEENT: normal  Neck: no JVD, carotid bruits, or masses Cardiac: RRR; no murmurs, rubs, or gallops,no edema  Respiratory:  clear to auscultation bilaterally, normal work of breathing GI: soft, nontender, nondistended, + BS MS: no deformity or atrophy  Skin: warm and dry, no rash Neuro:  Strength and sensation are intact Psych: euthymic mood, full affect   EKG:  EKG is ordered today. The ekg ordered today demonstrates sinus rhythm with first-degree AV block. Nonspecific T wave changes.   Recent Labs: 11/30/2015: BUN 22; Creatinine, Ser 1.05; Hemoglobin 11.8; Platelets 66; Potassium 4.8; Sodium 143    Lipid Panel  Component Value Date/Time   CHOL 206 (H) 10/13/2013 0624   TRIG 180 10/13/2013 0624   HDL 46 10/13/2013 0624   VLDL 36 10/13/2013 0624   LDLCALC 124 (H) 10/13/2013 0624      Wt Readings from Last 3 Encounters:  06/09/16 182 lb 1.9 oz (82.6 kg)  01/10/16 175 lb (79.4 kg)  11/30/15 184 lb (83.5 kg)        ASSESSMENT AND PLAN:  1.  Coronary artery disease involving native coronary arteries without angina: She is overall doing reasonably well. Most recent stress test in January was slightly abnormal but overall low risk. I recommend continuing medical therapy.  2. 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 I made no changes in her medications.  3. Hyperlipidemia: History of intolerance to statins. Currently on Zetia.   Disposition:   FU with me in 6 months  Signed,  Lorine Bears, MD  06/09/2016 1:58 PM    [facility]

## 2016-07-29 ENCOUNTER — Ambulatory Visit (INDEPENDENT_AMBULATORY_CARE_PROVIDER_SITE_OTHER): Payer: Medicare Other | Admitting: Podiatry

## 2016-07-29 ENCOUNTER — Encounter: Payer: Self-pay | Admitting: Podiatry

## 2016-07-29 DIAGNOSIS — I2510 Atherosclerotic heart disease of native coronary artery without angina pectoris: Secondary | ICD-10-CM

## 2016-07-29 DIAGNOSIS — M79676 Pain in unspecified toe(s): Secondary | ICD-10-CM

## 2016-07-29 DIAGNOSIS — L603 Nail dystrophy: Secondary | ICD-10-CM | POA: Diagnosis not present

## 2016-07-29 DIAGNOSIS — B351 Tinea unguium: Secondary | ICD-10-CM

## 2016-07-29 DIAGNOSIS — L601 Onycholysis: Secondary | ICD-10-CM

## 2016-07-29 NOTE — Progress Notes (Signed)
SUBJECTIVE Patient with a history of diabetes mellitus presents to office today complaining of elongated, thickened nails. Pain while ambulating in shoes. Patient is unable to trim their own nails.  Patient also has a complaint of a partially detached left great toenail. Patient relates trauma to the left great toe. Her husband tried to trim the nail back however they are concerned for discoloration and infection. Patient also states she has a history of a stroke affecting the right side of her body Allergies  Allergen Reactions  . Demerol [Meperidine] Other (See Comments)    Other Reaction: CNS Disorder  . Codeine     Reports that she gets jittery and does not feel right able to tolerate hydrocodone  . Ivp Dye [Iodinated Diagnostic Agents] Hives  . Shellfish Allergy Hives  . Statins Other (See Comments)    myalgia myalgia    OBJECTIVE General Patient is awake, alert, and oriented x 3 and in no acute distress. Derm Skin is dry and supple bilateral. Negative open lesions or macerations. Remaining integument unremarkable. Nails are tender, long, thickened and dystrophic with subungual debris, consistent with onychomycosis, 1-5 bilateral. No signs of infection noted. Onychodystrophy nail noted to the left great toe. Partial onychomycosis noted with a 75% detachment from the nail bed. Nail is thickened and dystrophic and discolored. Vasc  DP and PT pedal pulses palpable bilaterally. Temperature gradient within normal limits.  Neuro Epicritic and protective threshold sensation diminished bilaterally.  Musculoskeletal Exam No symptomatic pedal deformities noted bilateral. Muscular strength within normal limits.  ASSESSMENT 1. Diabetes Mellitus w/ peripheral neuropathy 2. Onychomycosis of nail due to dermatophyte bilateral 3. Pain in foot bilateral 4. Onycholysis left great toenail, with onychodystrophy  PLAN OF CARE 1. Patient evaluated today. Instructed to maintain good pedal hygiene and  foot care. 2. Stressed importance of controlling blood sugar.  3. Mechanical debridement of nails 1-5 bilaterally performed using a nail nipper. Filed with dremel without incident.  4. Temporary total nail avulsion was performed to the left great toenail. Local anesthesia filtration was utilized prior to avulsion using a 50-50 mixture of 2% lidocaine plain with 0.5% Marcaine plain in a digital block fashion. The entire left great toenail was removed and dressed sterile dressing was applied 5. All patient questions were answered. Return to clinic in 2 weeks   Rhonda ShellingBrent M Wanya Park, DPM

## 2016-07-29 NOTE — Patient Instructions (Signed)

## 2016-08-12 ENCOUNTER — Ambulatory Visit (INDEPENDENT_AMBULATORY_CARE_PROVIDER_SITE_OTHER): Payer: Medicare Other | Admitting: Podiatry

## 2016-08-12 DIAGNOSIS — L03039 Cellulitis of unspecified toe: Secondary | ICD-10-CM

## 2016-08-12 DIAGNOSIS — L89891 Pressure ulcer of other site, stage 1: Secondary | ICD-10-CM | POA: Diagnosis not present

## 2016-08-12 DIAGNOSIS — S91109D Unspecified open wound of unspecified toe(s) without damage to nail, subsequent encounter: Secondary | ICD-10-CM

## 2016-08-12 DIAGNOSIS — M79676 Pain in unspecified toe(s): Secondary | ICD-10-CM

## 2016-08-17 NOTE — Progress Notes (Signed)

## 2016-08-20 ENCOUNTER — Telehealth: Payer: Self-pay | Admitting: Cardiovascular Disease

## 2016-08-20 NOTE — Telephone Encounter (Signed)
Pt states her PCP changed pt Imdur(increased) and reduced her Toprol. Please call and discuss that this is ok.

## 2016-08-20 NOTE — Telephone Encounter (Signed)
Pt reports Dr. Lacie ScottsNiemeyer decreased metoprolol to 50mg  qd approximately one year ago. Also states she takes imdur 90mg  qd and would like to know if Dr. Kirke CorinArida agrees with these two medications. Advised pt these were on her medication list at July OV with Dr. Kirke CorinArida therefore, he is aware. He did not make any medication changes. She is agreeable to monitor BP, HR and sx and to call us with any changes.

## 2017-01-22 ENCOUNTER — Ambulatory Visit: Payer: Self-pay | Admitting: Cardiovascular Disease

## 2017-03-10 ENCOUNTER — Ambulatory Visit: Payer: Medicare Other | Admitting: Cardiovascular Disease

## 2017-03-31 ENCOUNTER — Ambulatory Visit: Payer: Medicare Other | Admitting: Nurse Practitioner

## 2017-04-01 ENCOUNTER — Encounter: Payer: Self-pay | Admitting: Nurse Practitioner

## 2017-06-13 IMAGING — NM NM MISC PROCEDURE
6 series · 36 of 36 positions shown · non-contrast
Comparison: none

[Series 1: wbr rest · 6.40mm/px · 6 of 64 frames shown]
[frame 6/64]
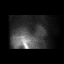
[frame 16/64]
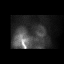
[frame 27/64]
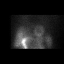
[frame 38/64]
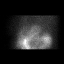
[frame 48/64]
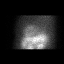
[frame 59/64]
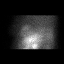

[Series 1: wbr_r-proj_st wbr rest · 6.40mm/px · 6 of 64 frames shown]
[frame 6/64]
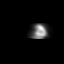
[frame 16/64]
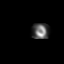
[frame 27/64]
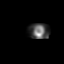
[frame 38/64]
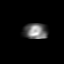
[frame 48/64]
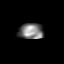
[frame 59/64]
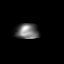

[Series 2: wbr stress-gsp · 6.40mm/px · 6 of 512 frames shown]
[frame 43/512]
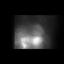
[frame 128/512]
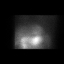
[frame 214/512]
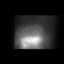
[frame 299/512]
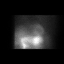
[frame 384/512]
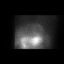
[frame 470/512]
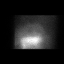

[Series 2: wbr_s-proj_st wbr stress-gsp · 6.40mm/px · 6 of 512 frames shown]
[frame 43/512]
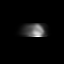
[frame 128/512]
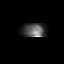
[frame 214/512]
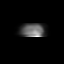
[frame 299/512]
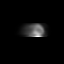
[frame 384/512]
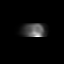
[frame 470/512]
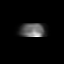

[Series 3: wbr_s-proj_st wbr stress-sum-em · 6.40mm/px · 6 of 64 frames shown]
[frame 6/64]
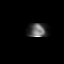
[frame 16/64]
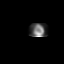
[frame 27/64]
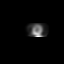
[frame 38/64]
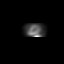
[frame 48/64]
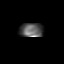
[frame 59/64]
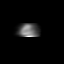

[Series 3: wbr stress-sum-em · 6.40mm/px · 6 of 64 frames shown]
[frame 6/64]
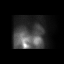
[frame 16/64]
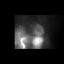
[frame 27/64]
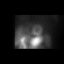
[frame 38/64]
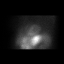
[frame 48/64]
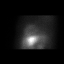
[frame 59/64]
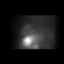

[36 of 36 positions shown; findings below may reference images not displayed]

Canned report from images found in remote index.

Refer to host system for actual result text.

## 2017-06-29 ENCOUNTER — Telehealth: Payer: Self-pay | Admitting: Cardiovascular Disease

## 2017-06-29 ENCOUNTER — Encounter: Payer: Self-pay | Admitting: Cardiovascular Disease

## 2017-06-29 NOTE — Telephone Encounter (Signed)
3 attempts to contact Mailed letter Deleting recall

## 2017-08-12 ENCOUNTER — Encounter: Payer: Self-pay | Admitting: Nurse Practitioner

## 2017-08-12 ENCOUNTER — Ambulatory Visit (INDEPENDENT_AMBULATORY_CARE_PROVIDER_SITE_OTHER): Payer: Medicare Other | Admitting: Nurse Practitioner

## 2017-08-12 VITALS — BP 118/60 | HR 65 | Ht 66.0 in | Wt 188.5 lb

## 2017-08-12 DIAGNOSIS — I10 Essential (primary) hypertension: Secondary | ICD-10-CM

## 2017-08-12 DIAGNOSIS — R079 Chest pain, unspecified: Secondary | ICD-10-CM | POA: Diagnosis not present

## 2017-08-12 DIAGNOSIS — I25119 Atherosclerotic heart disease of native coronary artery with unspecified angina pectoris: Secondary | ICD-10-CM | POA: Diagnosis not present

## 2017-08-12 DIAGNOSIS — E782 Mixed hyperlipidemia: Secondary | ICD-10-CM | POA: Diagnosis not present

## 2017-08-12 DIAGNOSIS — I209 Angina pectoris, unspecified: Secondary | ICD-10-CM

## 2017-08-12 NOTE — Patient Instructions (Addendum)
Medication Instructions:  Your physician recommends that you continue on your current medications as directed. Please refer to the Current Medication list given to you today.   Labwork: none  Testing/Procedures: Your physician has requested that you have a lexiscan myoview. For further information please visit [email or web address] Please follow instruction sheet, as given.  [facility] MYOVIEW  Your caregiver has ordered a Stress Test with nuclear imaging. The purpose of this test is to evaluate the blood supply to your heart muscle. This procedure is referred to as a "Non-Invasive Stress Test." This is because other than having an IV started in your vein, nothing is inserted or "invades" your body. Cardiac stress tests are done to find areas of poor blood flow to the heart by determining the extent of coronary artery disease (CAD). Some patients exercise on a treadmill, which naturally increases the blood flow to your heart, while others who are  unable to walk on a treadmill due to physical limitations have a pharmacologic/chemical stress agent called Lexiscan . This medicine will mimic walking on a treadmill by temporarily increasing your coronary blood flow.   Please note: these test may take anywhere between 2-4 hours to complete  PLEASE REPORT TO [facility] MEDICAL MALL ENTRANCE  THE VOLUNTEERS AT THE FIRST DESK WILL DIRECT YOU WHERE TO GO  Date of Procedure:__Monday, Oct 8__  Arrival Time for Procedure:  7:45am Instructions regarding medication:    __xx__:  Hold metoprolol the morning of procedure   PLEASE NOTIFY THE OFFICE AT LEAST 24 HOURS IN ADVANCE IF YOU ARE UNABLE TO KEEP YOUR APPOINTMENT.  [phone] AND  PLEASE NOTIFY NUCLEAR MEDICINE AT [facility] AT LEAST 24 HOURS IN ADVANCE IF YOU ARE UNABLE TO KEEP YOUR APPOINTMENT. [phone]  How to prepare for your Myoview test:  1. Do not eat or drink after midnight 2. No caffeine for 24 hours prior to test 3. No smoking 24 hours prior to  test. 4. Your medication may be taken with water.  If your doctor stopped a medication because of this test, do not take that medication. 5. Ladies, please do not wear dresses.  Skirts or pants are appropriate. Please wear a short sleeve shirt. 6. No perfume, cologne or lotion. 7. Wear comfortable walking shoes. No heels!            Follow-Up: Your physician recommends that you schedule a follow-up appointment in: 3 months with Dr. Kirke Corin.    Any Other Special Instructions Will Be Listed Below (If Applicable).     If you need a refill on your cardiac medications before your next appointment, please call your pharmacy.  Cardiac Nuclear Scan A cardiac nuclear scan is a test that measures blood flow to the heart when a person is resting and when he or she is exercising. The test looks for problems such as:  Not enough blood reaching a portion of the heart.  The heart muscle not working normally.  You may need this test if:  You have heart disease.  You have had abnormal lab results.  You have had heart surgery or angioplasty.  You have chest pain.  You have shortness of breath.  In this test, a radioactive dye (tracer) is injected into your bloodstream. After the tracer has traveled to your heart, an imaging device is used to measure how much of the tracer is absorbed by or distributed to various areas of your heart. This procedure is usually done at a hospital and takes 2-4 hours. Tell a health care  provider about:  Any allergies you have.  All medicines you are taking, including vitamins, herbs, eye drops, creams, and over-the-counter medicines.  Any problems you or family members have had with the use of anesthetic medicines.  Any blood disorders you have.  Any surgeries you have had.  Any medical conditions you have.  Whether you are pregnant or may be pregnant. What are the risks? Generally, this is a safe procedure. However, problems may occur,  including:  Serious chest pain and heart attack. This is only a risk if the stress portion of the test is done.  Rapid heartbeat.  Sensation of warmth in your chest. This usually passes quickly.  What happens before the procedure?  Ask your health care provider about changing or stopping your regular medicines. This is especially important if you are taking diabetes medicines or blood thinners.  Remove your jewelry on the day of the procedure. What happens during the procedure?  An IV tube will be inserted into one of your veins.  Your health care provider will inject a small amount of radioactive tracer through the tube.  You will wait for 20-40 minutes while the tracer travels through your bloodstream.  Your heart activity will be monitored with an electrocardiogram (ECG).  You will lie down on an exam table.  Images of your heart will be taken for about 15-20 minutes.  You may be asked to exercise on a treadmill or stationary bike. While you exercise, your heart's activity will be monitored with an ECG, and your blood pressure will be checked. If you are unable to exercise, you may be given a medicine to increase blood flow to parts of your heart.  When blood flow to your heart has peaked, a tracer will again be injected through the IV tube.  After 20-40 minutes, you will get back on the exam table and have more images taken of your heart.  When the procedure is over, your IV tube will be removed. The procedure may vary among health care providers and hospitals. Depending on the type of tracer used, scans may need to be repeated 3-4 hours later. What happens after the procedure?  Unless your health care provider tells you otherwise, you may return to your normal schedule, including diet, activities, and medicines.  Unless your health care provider tells you otherwise, you may increase your fluid intake. This will help flush the contrast dye from your body. Drink enough fluid  to keep your urine clear or pale yellow.  It is up to you to get your test results. Ask your health care provider, or the department that is doing the test, when your results will be ready. Summary  A cardiac nuclear scan measures the blood flow to the heart when a person is resting and when he or she is exercising.  You may need this test if you are at risk for heart disease.  Tell your health care provider if you are pregnant.  Unless your health care provider tells you otherwise, increase your fluid intake. This will help flush the contrast dye from your body. Drink enough fluid to keep your urine clear or pale yellow. This information is not intended to replace advice given to you by your health care provider. Make sure you discuss any questions you have with your health care provider. Document Released: 11/21/2004 Document Revised: 10/29/2016 Document Reviewed: 10/05/2013 Elsevier Interactive Patient Education  2017 [employer].

## 2017-08-12 NOTE — Progress Notes (Signed)
Office Visit    Patient Name: Rhonda Park Date of Encounter: 08/12/2017  Primary Care Provider:  Armando Gang, FNP Primary Cardiologist:  Judie Petit. Kirke Corin, MD   Chief Complaint    76 year old female with a history of CAD status post RCA stenting, diastolic dysfunction, hypertension, hyperlipidemia, diet-controlled diabetes, hypothyroidism, and prior cerebellar stroke, who presents for follow-up related to chest discomfort.  Past Medical History    Past Medical History:  Diagnosis Date  . Anemia   . Anxiety   . Arthritis   . Cardiomegaly   . 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 Cervicalgia   . Chronic airway obstruction, not elsewhere classified   . Coronary artery disease    a. Remote RCA stent;  b. Most recent cardiac catheterization in May of 2010 showed an ejection fraction of 65% with 50% ostial left main stenosis and 90% distal LAD stenosis close to the apex;  c. 11/2015 Low risk MV w/ subtle antlat ischemia.  . Diastolic dysfunction    a. 11/2015 Echo: EF 60-65%, Gr1 DD, mild AI, midly dil LA.  . DOE (dyspnea on exertion)    a. With wheezing - does not like to use inhalers.  . Essential hypertension    a. With evidence of hyperaldosteronism with severe hypokalemia. 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  Marland Kitchen GERD (gastroesophageal reflux disease)   . Headache(784.0)   . History of blood transfusion    30 units child birth  . Hyperlipidemia   . Hypothyroidism   . Type II or unspecified type diabetes mellitus without mention of complication, not stated as uncontrolled    Not on medications  . UC (ulcerative colitis) (HCC)   . Unspecified congenital cystic kidney disease    Past Surgical History:  Procedure Laterality Date  . ABDOMINAL HYSTERECTOMY    . ANTERIOR CERVICAL DECOMP/DISCECTOMY FUSION N/A 05/23/2014   Procedure: CERVICAL FIVE SIX ANTERIOR CERVICAL DECOMPRESSION/DISCECTOMY FUSION 1 LEVEL;  Surgeon: Temple Pacini, MD;  Location: [facility];  Service: Neurosurgery;  Laterality: N/A;  . APPENDECTOMY    . BACK SURGERY    . BREAST CYST EXCISION Left    3  . CARDIAC CATHETERIZATION  03/2009   [facility]  . CARDIAC CATHETERIZATION  05/2007   [facility]  . CARDIAC CATHETERIZATION  05/2009   ''  . CARDIAC CATHETERIZATION  07/2005   ''  . CHOLECYSTECTOMY    . hysterectomy    . KNEE SURGERY Right    "growth"  . LUMBAR LAMINECTOMY/DECOMPRESSION MICRODISCECTOMY Right 11/30/2015   Procedure: LUMBAR LAMINECTOMY/DECOMPRESSION MICRODISCECTOMY 1 LEVEL;  Surgeon: Julio Sicks, MD;  Location: [facility];  Service: Neurosurgery;  Laterality: Right;  . posterior segment     unlisted procedure  . TONSILLECTOMY      Allergies  Allergies  Allergen Reactions  . Demerol [Meperidine] Other (See Comments)    Other Reaction: CNS Disorder  . Codeine     Reports that she gets jittery and does not feel right able to tolerate hydrocodone  . Ivp Dye [Iodinated Diagnostic Agents] Hives  . Shellfish Allergy Hives  . Statins Other (See Comments)    myalgia myalgia    History of Present Illness    76 year old female with the above complex past medical history including CAD status post remote right coronary artery stenting with subsequent catheterization 2014 revealing apical LAD disease and patent RCA stent. January 2017, she underwent stress testing for chest discomfort and this was low risk. Other  history includes hypertension, hyperlipidemia, diet-controlled diabetes, obesity, cerebellar stroke, diastolic dysfunction, ongoing tobacco abuse, and COPD. She was last seen in clinic over one year ago. She says that over the past 6 months or so, she notes intermittent dyspnea and wheezing with activity. She is prescribed inhalers but doesn't like the way to make her feel so she doesn't use them. She continues to smoke about 3-4 cigarettes a day. She has also noticed intermittent substernal chest discomfort. She initially told me that this was occurring with  physical exertion but on further questioning, she backed down and said that actually this only occurs with emotional upset. It can last several minutes and resolved spontaneously. She also reports low back pain, which is chronic. With ambulation, that pain can radiate into her buttocks and also she develops cramping and burning in her calves. She reports that she had arterial brachial indices in September, ordered by her primary care provider. We have reached out to their office for results but at this point, we do not have them on hand. She denies PND, orthopnea, syncope, edema, or early satiety.  Home Medications    Prior to Admission medications   Medication Sig Start Date End Date Taking? Authorizing Provider  amLODipine (NORVASC) 10 MG tablet Take 10 mg by mouth daily.   Yes [provider]  b complex vitamins tablet Take 1 tablet by mouth daily.   Yes [provider]  Cholecalciferol (VITAMIN D-3) 5000 UNITS TABS Take 5,000 Units by mouth daily.    Yes [provider]  clonazePAM (KLONOPIN) 1 MG tablet Take 1 mg by mouth 2 (two) times daily.   Yes [provider]  clopidogrel (PLAVIX) 75 MG tablet Take 1 tablet (75 mg total) by mouth daily with breakfast. 05/29/14  Yes Iran Ouch, MD  ezetimibe (ZETIA) 10 MG tablet Take 10 mg by mouth daily.   Yes [provider]  folic acid (FOLVITE) 400 MCG tablet Take 400 mcg by mouth daily.   Yes [provider]  isosorbide mononitrate (IMDUR) 60 MG 24 hr tablet Take 1.5 tablets (90 mg total) by mouth daily. 10/24/14  Yes Dunn, Raymon Mutton, PA-C  levothyroxine (SYNTHROID, LEVOTHROID) 100 MCG tablet Take 100 mcg by mouth daily before breakfast.   Yes [provider]  metoprolol succinate (TOPROL-XL) 100 MG 24 hr tablet Take 50 mg by mouth daily. Take with or immediately following a meal.   Yes [provider]  sertraline (ZOLOFT) 100 MG tablet Take 100 mg by mouth daily.   Yes [provider]  spironolactone (ALDACTONE) 25 MG tablet Take 25 mg by mouth 2 (two) times daily.    Yes [provider]    Review of Systems    Chest discomfort as above. Chronic dyspnea on exertion associated wheezing. Lower extremity discomfort and possibly claudication.  All other systems reviewed and are otherwise negative except as noted above.  Physical Exam    VS:  BP 118/60 (BP Location: Left Arm, Patient Position: Sitting, Cuff Size: Normal)   Pulse 65   Ht  (1.676 m)   Wt 188 lb 8 oz (85.5 kg)   BMI 30.42 kg/m  , BMI Body mass index is 30.42 kg/m. GEN: Well nourished, well developed, in no acute distress.  HEENT: normal.  Neck: Supple, no JVD, carotid bruits, or masses. Cardiac: RRR, no murmurs, rubs, or gallops. No clubbing, cyanosis, edema.  Radials/DP/PT 2+ and equal bilaterally.  Respiratory:  Respirations regular and unlabored, diminished  breath sounds bilaterally. GI: Soft, nontender, nondistended, BS + x 4. MS: no deformity or atrophy. Skin: warm and dry, no rash. Neuro:  Strength and sensation are intact. Psych: Normal affect.  Accessory Clinical Findings    ECG - Regular sinus rhythm, 65, nonspecific T changes. No acute changes.  Assessment & Plan    1.  Chest pain/coronary artery disease: Patient with prior history of right coronary artery stenting. Last catheterization 2010 showed apical LAD disease with patent right coronary artery stent. General low risk Myoview in January 2017. She says today that she's been having intermittent chest discomfort. Initially, she said this occurs with activity and is associated with dyspnea on exertion but then later said this occurs with emotional upset. It can happen a few times per day as she is often emotionally upset. I will arrange for a repeat Lexiscan Myoview to assess for ischemia. She remains on Plavix, nitrates, beta blocker therapy. She is not on a statin secondary to prior intolerance. She is on  Zetia.  2. Essential hypertension: Stable on beta blocker, calcium channel blocker, nitrate, and spironolactone therapy.  3. Hyperlipidemia: Intolerant to statins. She had recent labs in June with primary care and her LDL was 184. She was not taking her Zetia at that point and this has been resumed. She says she just had repeat labs yesterday. She may require PCSK9i therapy if not @ goal.  4. Reported history of diet-controlled diabetes: Recent A1c was 5.8.  5. Claudication: Patient is chronic low back pain and more recently has been noting buttock/hip and calf pain with ambulation.  Her husband says she recently had ABIs performed through her primary care provider. We did request labs and studies from primary care but only received labs from June. We will recheck her again to try and obtain ABI report. There is no mention of ABIs in yesterday's clinic note.  6. Disposition: Follow-up stress testing as above. We will try and obtain ABIs. Follow up in clinic in 3 months or sooner if necessary.   Rhonda Ducking, NP 08/12/2017, 3:51 PM

## 2017-08-17 ENCOUNTER — Encounter
Admission: RE | Admit: 2017-08-17 | Discharge: 2017-08-17 | Disposition: A | Discharge status: Home / Self Care | Payer: Medicare Other | Source: Ambulatory Visit | Attending: Nurse Practitioner | Admitting: Nurse Practitioner

## 2017-08-17 DIAGNOSIS — R079 Chest pain, unspecified: Secondary | ICD-10-CM | POA: Insufficient documentation

## 2017-08-17 LAB — NM MYOCAR MULTI W/SPECT W/WALL MOTION / EF
CHL CUP MPHR: 146 {beats}/min
CHL CUP NUCLEAR SDS: 0
CHL CUP NUCLEAR SSS: 0
CHL CUP RESTING HR STRESS: 68 {beats}/min
CSEPEDS: 0 s
CSEPEW: 1 METS
CSEPPHR: 82 {beats}/min
Exercise duration (min): 0 min
LV dias vol: 51 mL (ref 46–106)
LVSYSVOL: 13 mL
NUC STRESS TID: 1
Percent HR: 56 %
SRS: 0

## 2017-08-17 MED ORDER — TECHNETIUM TC 99M TETROFOSMIN IV KIT
13.0000 | PACK | Freq: Once | INTRAVENOUS | Status: AC | PRN
  Administered 2017-08-17: 11.64 via INTRAVENOUS

## 2017-08-17 MED ORDER — TECHNETIUM TC 99M TETROFOSMIN IV KIT
30.0000 | PACK | Freq: Once | INTRAVENOUS | Status: AC
  Administered 2017-08-17: 32.72 via INTRAVENOUS

## 2017-08-17 MED ORDER — REGADENOSON 0.4 MG/5ML IV SOLN
0.4000 mg | Freq: Once | INTRAVENOUS | Status: AC
  Administered 2017-08-17: 0.4 mg via INTRAVENOUS

## 2017-11-27 ENCOUNTER — Emergency Department
Admission: EM | Admit: 2017-11-27 | Discharge: 2017-11-27 | Disposition: A | Discharge status: Home / Self Care | Payer: Medicare Other | Attending: Emergency Medicine | Admitting: Emergency Medicine

## 2017-11-27 ENCOUNTER — Emergency Department: Payer: Medicare Other

## 2017-11-27 DIAGNOSIS — I2510 Atherosclerotic heart disease of native coronary artery without angina pectoris: Secondary | ICD-10-CM | POA: Insufficient documentation

## 2017-11-27 DIAGNOSIS — E119 Type 2 diabetes mellitus without complications: Secondary | ICD-10-CM | POA: Insufficient documentation

## 2017-11-27 DIAGNOSIS — J069 Acute upper respiratory infection, unspecified: Secondary | ICD-10-CM | POA: Insufficient documentation

## 2017-11-27 DIAGNOSIS — I10 Essential (primary) hypertension: Secondary | ICD-10-CM | POA: Diagnosis not present

## 2017-11-27 DIAGNOSIS — E039 Hypothyroidism, unspecified: Secondary | ICD-10-CM | POA: Diagnosis not present

## 2017-11-27 DIAGNOSIS — F17210 Nicotine dependence, cigarettes, uncomplicated: Secondary | ICD-10-CM | POA: Diagnosis not present

## 2017-11-27 DIAGNOSIS — R0981 Nasal congestion: Secondary | ICD-10-CM | POA: Diagnosis present

## 2017-11-27 LAB — CBC
HCT: 40.8 % (ref 35.0–47.0)
Hemoglobin: 13.3 g/dL (ref 12.0–16.0)
MCH: 30.4 pg (ref 26.0–34.0)
MCHC: 32.6 g/dL (ref 32.0–36.0)
MCV: 93.3 fL (ref 80.0–100.0)
PLATELETS: 167 10*3/uL (ref 150–440)
RBC: 4.37 MIL/uL (ref 3.80–5.20)
RDW: 14 % (ref 11.5–14.5)
WBC: 6.5 10*3/uL (ref 3.6–11.0)

## 2017-11-27 LAB — COMPREHENSIVE METABOLIC PANEL
ALBUMIN: 4.3 g/dL (ref 3.5–5.0)
ALK PHOS: 50 U/L (ref 38–126)
ALT: 19 U/L (ref 14–54)
AST: 27 U/L (ref 15–41)
Anion gap: 11 (ref 5–15)
BUN: 19 mg/dL (ref 6–20)
CALCIUM: 9.3 mg/dL (ref 8.9–10.3)
CO2: 25 mmol/L (ref 22–32)
CREATININE: 1.28 mg/dL — AB (ref 0.44–1.00)
Chloride: 104 mmol/L (ref 101–111)
GFR calc non Af Amer: 40 mL/min — ABNORMAL LOW (ref >60)
GFR, EST AFRICAN AMERICAN: 47 mL/min — AB (ref >60)
GLUCOSE: 216 mg/dL — AB (ref 65–99)
Potassium: 5.2 mmol/L — ABNORMAL HIGH (ref 3.5–5.1)
SODIUM: 140 mmol/L (ref 135–145)
Total Bilirubin: 0.7 mg/dL (ref 0.3–1.2)
Total Protein: 7.2 g/dL (ref 6.5–8.1)

## 2017-11-27 LAB — LIPASE, BLOOD: Lipase: 36 U/L (ref 11–51)

## 2017-11-27 MED ORDER — HYDROCOD POLST-CPM POLST ER 10-8 MG/5ML PO SUER: 5.0000 mL | Freq: Two times a day (BID) | ORAL | 0 refills | Status: DC

## 2017-11-27 MED ORDER — AZITHROMYCIN 250 MG PO TABS: ORAL_TABLET | ORAL | 0 refills | Status: DC

## 2017-11-27 NOTE — ED Notes (Signed)
Patient transported to X-ray 

## 2017-11-27 NOTE — ED Triage Notes (Signed)
Per pt she was seen by PCP yesterday and given amoxicillin for URI symptoms and states that her PCP thinks that her stomach had a hernia there.  Pt also reports recent trauma to stomach after falling out of her bed approx 2-3 months ago.  Pt states she has had decreased appetite, increased diarrhea and increased abdominal pain since this happened.  Pt states there her PCP did not did any xrays yesterday.  Pt states she had a stroke that has effected her speech and her memory some.  Pt states that her kids told her to come here and be evaluated because they were concerned.

## 2017-11-27 NOTE — ED Provider Notes (Signed)
[facility] Emergency Department Provider Note       Time seen: ----------------------------------------- 5:18 PM on 11/27/2017 -----------------------------------------   I have reviewed the triage vital signs and the nursing notes.  HISTORY   Chief Complaint Abdominal Pain and Diarrhea    HPI Rhonda Park is a 77 y.o. female with a history of anemia, anxiety, stroke, CAD, GERD who presents to the ED for multiple complaints.  Patient has had congestion and cough and was recently placed on amoxicillin by her primary care doctor for same.  She also reports some trauma to her ribs after falling out of the bed approximately 2-3 months ago.  She has had decreased appetite and diarrhea.  She also states she feels like she is in a haze and does not feel right.  Past Medical History:  Diagnosis Date  . Anemia   . Anxiety   . Arthritis   . Cardiomegaly   . 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 Cervicalgia   . Chronic airway obstruction, not elsewhere classified   . Coronary artery disease    a. Remote RCA stent;  b. Most recent cardiac catheterization in May of 2010 showed an ejection fraction of 65% with 50% ostial left main stenosis and 90% distal LAD stenosis close to the apex;  c. 11/2015 Low risk MV w/ subtle antlat ischemia.  . Diastolic dysfunction    a. 11/2015 Echo: EF 60-65%, Gr1 DD, mild AI, midly dil LA.  . DOE (dyspnea on exertion)    a. With wheezing - does not like to use inhalers.  . Essential hypertension    a. With evidence of hyperaldosteronism with severe hypokalemia. 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  Marland Kitchen GERD (gastroesophageal reflux disease)   . Headache(784.0)   . History of blood transfusion    30 units child birth  . Hyperlipidemia   . Hypothyroidism   . Type II or unspecified type diabetes mellitus without mention of complication, not stated as uncontrolled    Not on medications  . UC (ulcerative  colitis) (HCC)   . Unspecified congenital cystic kidney disease     Patient Active Problem List   Diagnosis Date Noted  . S/P lumbar laminectomy 12/01/2015  . 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 Spinal stenosis in cervical region 05/23/2014  . 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 Abdominal aortic aneurysm (HCC) 10/27/2013  . Coronary artery disease   . Essential hypertension   . Hyperlipidemia     Past Surgical History:  Procedure Laterality Date  . ABDOMINAL HYSTERECTOMY    . ANTERIOR CERVICAL DECOMP/DISCECTOMY FUSION N/A 05/23/2014   Procedure: CERVICAL FIVE SIX ANTERIOR CERVICAL DECOMPRESSION/DISCECTOMY FUSION 1 LEVEL;  Surgeon: Temple Pacini, MD;  Location: [facility];  Service: Neurosurgery;  Laterality: N/A;  . APPENDECTOMY    . BACK SURGERY    . BREAST CYST EXCISION Left    3  . CARDIAC CATHETERIZATION  03/2009   [facility]  . CARDIAC CATHETERIZATION  05/2007   [facility]  . CARDIAC CATHETERIZATION  05/2009   ''  . CARDIAC CATHETERIZATION  07/2005   ''  . CHOLECYSTECTOMY    . hysterectomy    . KNEE SURGERY Right    "growth"  . LUMBAR LAMINECTOMY/DECOMPRESSION MICRODISCECTOMY Right 11/30/2015   Procedure: LUMBAR LAMINECTOMY/DECOMPRESSION MICRODISCECTOMY 1 LEVEL;  Surgeon: Julio Sicks, MD;  Location: [facility];  Service: Neurosurgery;  Laterality: Right;  . posterior segment  unlisted procedure  . TONSILLECTOMY      Allergies Demerol [meperidine]; Codeine; Ivp dye [iodinated diagnostic agents]; Shellfish allergy; and Statins  Social History Social History   Tobacco Use  . Smoking status: Current Every Day Smoker    Packs/day: 0.02    Types: Cigarettes  . Smokeless tobacco: Never Used  Substance Use Topics  . Alcohol use: No  . Drug use: No    Review of Systems Constitutional: Negative for fever. Eyes: Positive for vision changes ENT:  Negative for congestion, sore throat Cardiovascular: Negative for chest  pain. Respiratory: Negative for shortness of breath. Gastrointestinal: Negative for abdominal pain, vomiting and diarrhea. Musculoskeletal: Negative for back pain. Skin: Negative for rash. Neurological: Negative for headaches, focal weakness or numbness.  All systems negative/normal/unremarkable except as stated in the HPI  ____________________________________________   PHYSICAL EXAM:  VITAL SIGNS: ED Triage Vitals  Enc Vitals Group     BP 11/27/17 1609 (!) 119/108     Pulse Rate 11/27/17 1609 70     Resp 11/27/17 1609 18     Temp 11/27/17 1609 98.5 F (36.9 C)     Temp Source 11/27/17 1609 Oral     SpO2 11/27/17 1609 97 %     Weight 11/27/17 1611 188 lb (85.3 kg)     Height --      Head Circumference --      Peak Flow --      Pain Score 11/27/17 1610 6     Pain Loc --      Pain Edu? --      Excl. in GC? --    Constitutional: Alert and oriented. Well appearing and in no distress. Eyes: Conjunctivae are normal. Normal extraocular movements. ENT   Head: Normocephalic and atraumatic.   Nose: No congestion/rhinnorhea.   Mouth/Throat: Mucous membranes are moist.   Neck: No stridor. Cardiovascular: Normal rate, regular rhythm. No murmurs, rubs, or gallops. Respiratory: Normal respiratory effort without tachypnea nor retractions. Breath sounds are clear and equal bilaterally. No wheezes/rales/rhonchi. Gastrointestinal: Soft and nontender. Normal bowel sounds Musculoskeletal: Nontender with normal range of motion in extremities. No lower extremity tenderness nor edema. Neurologic:  Normal speech and language. No gross focal neurologic deficits are appreciated.  Skin:  Skin is warm, dry and intact. No rash noted. Psychiatric: Mood and affect are normal. Speech and behavior are normal.  ____________________________________________  ED COURSE:  As part of my medical decision making, I reviewed the following data within the electronic MEDICAL RECORD NUMBER History  obtained from family if available, nursing notes, old chart and ekg, as well as notes from prior ED visits. Patient presented for multiple complaints, we will assess with labs and imaging as indicated at this time.   Procedures ____________________________________________   LABS (pertinent positives/negatives)  Labs Reviewed  COMPREHENSIVE METABOLIC PANEL - Abnormal; Notable for the following components:      Result Value   Potassium 5.2 (*)    Glucose, Bld 216 (*)    Creatinine, Ser 1.28 (*)    GFR calc non Af Amer 40 (*)    GFR calc Af Amer 47 (*)    All other components within normal limits  LIPASE, BLOOD  CBC  URINALYSIS, COMPLETE (UACMP) WITH MICROSCOPIC    RADIOLOGY Images were viewed by me  Chest x-ray IMPRESSION: Subsegmental atelectasis within the right middle lobe.  Upper limits normal heart size. ____________________________________________  DIFFERENTIAL DIAGNOSIS   URI, pneumonia, rib fracture, sinusitis, dehydration, electrolyte abnormality  FINAL ASSESSMENT AND  PLAN  URI   Plan: Patient had presented for multiple complaints. Patient's labs did not reveal any acute process. Patient's imaging revealed subsegmental atelectasis with no obvious pneumonia.  I will place her on a Z-Pak instead of her amoxicillin and prescribe cough medicine.  She is stable for outpatient follow-up.   Emily FilbertWilliams, Jonathan E, MD   Note: This note was generated in part or whole with voice recognition software. Voice recognition is usually quite accurate but there are transcription errors that can and very often do occur. I apologize for any typographical errors that were not detected and corrected.     Emily FilbertWilliams, Jonathan E, MD 11/27/17 Rickey [id number]

## 2017-11-27 NOTE — ED Notes (Signed)
Pt ambulatory without difficulty. VSS. NAD. Discharge instruction, RX and follow up reviewed. All questions answered.

## 2018-03-04 ENCOUNTER — Emergency Department
Admission: EM | Admit: 2018-03-04 | Discharge: 2018-03-05 | Disposition: A | Discharge status: Home / Self Care | Payer: Medicare Other | Attending: Emergency Medicine | Admitting: Emergency Medicine

## 2018-03-04 DIAGNOSIS — I5032 Chronic diastolic (congestive) heart failure: Secondary | ICD-10-CM | POA: Diagnosis not present

## 2018-03-04 DIAGNOSIS — Z7902 Long term (current) use of antithrombotics/antiplatelets: Secondary | ICD-10-CM | POA: Diagnosis not present

## 2018-03-04 DIAGNOSIS — Z79899 Other long term (current) drug therapy: Secondary | ICD-10-CM | POA: Insufficient documentation

## 2018-03-04 DIAGNOSIS — I2510 Atherosclerotic heart disease of native coronary artery without angina pectoris: Secondary | ICD-10-CM | POA: Insufficient documentation

## 2018-03-04 DIAGNOSIS — F0630 Mood disorder due to known physiological condition, unspecified: Secondary | ICD-10-CM

## 2018-03-04 DIAGNOSIS — E119 Type 2 diabetes mellitus without complications: Secondary | ICD-10-CM | POA: Diagnosis not present

## 2018-03-04 DIAGNOSIS — F17210 Nicotine dependence, cigarettes, uncomplicated: Secondary | ICD-10-CM | POA: Diagnosis not present

## 2018-03-04 DIAGNOSIS — I110 Hypertensive heart disease with heart failure: Secondary | ICD-10-CM | POA: Insufficient documentation

## 2018-03-04 DIAGNOSIS — Z7984 Long term (current) use of oral hypoglycemic drugs: Secondary | ICD-10-CM | POA: Diagnosis not present

## 2018-03-04 DIAGNOSIS — I69398 Other sequelae of cerebral infarction: Secondary | ICD-10-CM

## 2018-03-04 DIAGNOSIS — R4689 Other symptoms and signs involving appearance and behavior: Secondary | ICD-10-CM | POA: Diagnosis present

## 2018-03-04 LAB — COMPREHENSIVE METABOLIC PANEL
ALBUMIN: 5 g/dL (ref 3.5–5.0)
ALT: 29 U/L (ref 14–54)
ANION GAP: 8 (ref 5–15)
AST: 28 U/L (ref 15–41)
Alkaline Phosphatase: 52 U/L (ref 38–126)
BUN: 13 mg/dL (ref 6–20)
CO2: 27 mmol/L (ref 22–32)
Calcium: 9.6 mg/dL (ref 8.9–10.3)
Chloride: 102 mmol/L (ref 101–111)
Creatinine, Ser: 1.06 mg/dL — ABNORMAL HIGH (ref 0.44–1.00)
GFR calc Af Amer: 58 mL/min — ABNORMAL LOW (ref >60)
GFR calc non Af Amer: 50 mL/min — ABNORMAL LOW (ref >60)
GLUCOSE: 108 mg/dL — AB (ref 65–99)
POTASSIUM: 4.6 mmol/L (ref 3.5–5.1)
Sodium: 137 mmol/L (ref 135–145)
Total Bilirubin: 0.6 mg/dL (ref 0.3–1.2)
Total Protein: 7.7 g/dL (ref 6.5–8.1)

## 2018-03-04 LAB — URINE DRUG SCREEN, QUALITATIVE (ARMC ONLY)
AMPHETAMINES, UR SCREEN: NOT DETECTED
BENZODIAZEPINE, UR SCRN: NOT DETECTED
Barbiturates, Ur Screen: NOT DETECTED
COCAINE METABOLITE, UR ~~LOC~~: NOT DETECTED
Cannabinoid 50 Ng, Ur ~~LOC~~: NOT DETECTED
MDMA (Ecstasy)Ur Screen: NOT DETECTED
METHADONE SCREEN, URINE: NOT DETECTED
OPIATE, UR SCREEN: NOT DETECTED
Phencyclidine (PCP) Ur S: NOT DETECTED
Tricyclic, Ur Screen: NOT DETECTED

## 2018-03-04 LAB — CBC
HEMATOCRIT: 40 % (ref 35.0–47.0)
HEMOGLOBIN: 13.8 g/dL (ref 12.0–16.0)
MCH: 31.6 pg (ref 26.0–34.0)
MCHC: 34.4 g/dL (ref 32.0–36.0)
MCV: 91.9 fL (ref 80.0–100.0)
Platelets: 188 10*3/uL (ref 150–440)
RBC: 4.35 MIL/uL (ref 3.80–5.20)
RDW: 13.5 % (ref 11.5–14.5)
WBC: 7.8 10*3/uL (ref 3.6–11.0)

## 2018-03-04 LAB — ACETAMINOPHEN LEVEL

## 2018-03-04 LAB — ETHANOL: Alcohol, Ethyl (B): <10 mg/dL (ref <10)

## 2018-03-04 LAB — SALICYLATE LEVEL: Salicylate Lvl: <7 mg/dL (ref 2.8–30.0)
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
## 2018-03-04 NOTE — ED Notes (Signed)
PT IVC'D BY [employer]/PT CAME FROM [facility]/MD VERONESE/RN KIM AND ODS SECURITY MADE AWARE. PT PENDING SOC CONSULT/SOC CALLED.

## 2018-03-05 DIAGNOSIS — R4689 Other symptoms and signs involving appearance and behavior: Secondary | ICD-10-CM | POA: Diagnosis not present

## 2018-03-05 DIAGNOSIS — F0630 Mood disorder due to known physiological condition, unspecified: Secondary | ICD-10-CM

## 2018-03-05 DIAGNOSIS — I69398 Other sequelae of cerebral infarction: Secondary | ICD-10-CM | POA: Diagnosis not present

## 2018-03-05 LAB — TSH: TSH: 1.612 u[IU]/mL (ref 0.350–4.500)

## 2018-03-05 MED ORDER — B COMPLEX PO TABS
1.0000 | ORAL_TABLET | Freq: Every day | ORAL | Status: DC
  Filled 2018-03-05: qty 1

## 2018-03-05 MED ORDER — EZETIMIBE 10 MG PO TABS
10.0000 mg | ORAL_TABLET | Freq: Every day | ORAL | Status: DC
  Filled 2018-03-05: qty 1

## 2018-03-05 MED ORDER — VITAMIN D 1000 UNITS PO TABS
5000.0000 [IU] | ORAL_TABLET | Freq: Every day | ORAL | Status: DC
  Administered 2018-03-05: 5000 [IU] via ORAL
  Filled 2018-03-05: qty 5

## 2018-03-05 MED ORDER — SERTRALINE HCL 50 MG PO TABS
100.0000 mg | ORAL_TABLET | Freq: Two times a day (BID) | ORAL | Status: DC
  Administered 2018-03-05: 100 mg via ORAL
  Filled 2018-03-05: qty 2

## 2018-03-05 MED ORDER — TOTAL B/C PO TABS
1.0000 | ORAL_TABLET | Freq: Every day | ORAL | Status: DC
  Administered 2018-03-05: 1 via ORAL
  Filled 2018-03-05: qty 1

## 2018-03-05 MED ORDER — ISOSORBIDE MONONITRATE ER 60 MG PO TB24
90.0000 mg | ORAL_TABLET | Freq: Every day | ORAL | Status: DC
  Administered 2018-03-05: 90 mg via ORAL
  Filled 2018-03-05: qty 2

## 2018-03-05 MED ORDER — AMLODIPINE BESYLATE 5 MG PO TABS
10.0000 mg | ORAL_TABLET | Freq: Every day | ORAL | Status: DC
  Administered 2018-03-05: 10 mg via ORAL
  Filled 2018-03-05: qty 2

## 2018-03-05 MED ORDER — CLOPIDOGREL BISULFATE 75 MG PO TABS: 75.0000 mg | ORAL_TABLET | Freq: Every day | ORAL | Status: DC

## 2018-03-05 MED ORDER — CLONAZEPAM 0.5 MG PO TABS
1.0000 mg | ORAL_TABLET | Freq: Two times a day (BID) | ORAL | Status: DC
  Administered 2018-03-05: 1 mg via ORAL
  Filled 2018-03-05: qty 2

## 2018-03-05 MED ORDER — SPIRONOLACTONE 25 MG PO TABS: 25.0000 mg | ORAL_TABLET | Freq: Two times a day (BID) | ORAL | Status: DC

## 2018-03-05 MED ORDER — LEVOTHYROXINE SODIUM 50 MCG PO TABS: 100.0000 ug | ORAL_TABLET | Freq: Every day | ORAL | Status: DC

## 2018-03-05 MED ORDER — METOPROLOL SUCCINATE ER 50 MG PO TB24
50.0000 mg | ORAL_TABLET | Freq: Every day | ORAL | Status: DC
  Administered 2018-03-05: 50 mg via ORAL
  Filled 2018-03-05: qty 1

## 2018-03-05 MED ORDER — FLUTICASONE FUROATE-VILANTEROL 100-25 MCG/INH IN AEPB: 1.0000 | INHALATION_SPRAY | Freq: Every day | RESPIRATORY_TRACT | Status: DC

## 2018-03-05 NOTE — ED Notes (Signed)
PT IVC/SOC COMPLETE/PT PENDING PLACEMENT. 

## 2018-03-05 NOTE — ED Notes (Signed)
Patient states she has called her son to pick her up, but he is unable to get off work until [employer]5pm.  Will dc patient to lobby to wait for family.
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
## 2018-03-05 NOTE — ED Provider Notes (Signed)
-----------------------------------------   2:28 AM on 03/05/2018 -----------------------------------------  [facility] Hermelinda MedicusAlvarado who recommends admission to inpatient psychiatry service, continue IVC.  No new medication recommendations.  Will check TSH per her recommendations.   ----------------------------------------- 5:44 AM on 03/05/2018 -----------------------------------------  TSH noted; unremarkable.   Irean HongSung, Jade J, MD 03/05/18 [phone]
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
## 2018-03-05 NOTE — ED Notes (Signed)
Report given to SOC 
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
## 2018-03-05 NOTE — ED Notes (Signed)

## 2018-03-05 NOTE — ED Notes (Signed)
APS case worked followed up on patient, and she was updated that patient was being dc and being picked up by family.
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
## 2018-03-05 NOTE — ED Notes (Signed)
Pt is on phone with son for ride.

## 2018-03-05 NOTE — ED Provider Notes (Signed)
Dr. Mat Carnelay packs a seen the patient in cleared her for discharge.   Arnaldo NatalMalinda, Mariya Mottley F, MD 03/05/18 [phone]

## 2018-05-18 ENCOUNTER — Ambulatory Visit: Payer: Self-pay | Admitting: Nurse Practitioner

## 2018-05-19 ENCOUNTER — Encounter: Payer: Self-pay | Admitting: Nurse Practitioner

## 2018-06-23 ENCOUNTER — Telehealth: Payer: Self-pay | Admitting: Cardiovascular Disease

## 2018-06-23 NOTE — Telephone Encounter (Signed)
Returned the call to the patient. She stated that she has an injured back and had to walk a lot yesterday. She also stated that she has a big dog that she had to carry yesterday as well.   By the time she got home she had pain in her chest that radiated to her back. The pain lasted around an hour, coming and going. She stated that lying down did ease the pain.  The patient stated that she does not have any pain or shortness of breath today. She has been set up with appointment for tomorrow with Dr. Kirke CorinArida with the understanding that she will go to the ED if the chest pain reoccurs.

## 2018-06-23 NOTE — Telephone Encounter (Signed)
Pt states she had an episode last night with hurting in her chest and her whole left side and into her neck.  Pt c/o of Chest Pain: STAT if CP now or developed within 24 hours  1. Are you having CP right now? no  2. Are you experiencing any other symptoms (ex. SOB, nausea, vomiting, sweating)? Sweating, states she had a crushing feeling.   3. How long have you been experiencing CP? It seemed like an hour, but she doesn't think it was that long.   4. Is your CP continuous or coming and going? It would ease off, but never went completely away  5. Have you taken Nitroglycerin? no?

## 2018-06-24 ENCOUNTER — Ambulatory Visit (INDEPENDENT_AMBULATORY_CARE_PROVIDER_SITE_OTHER): Payer: Medicare Other | Admitting: Nurse Practitioner

## 2018-06-24 ENCOUNTER — Ambulatory Visit: Payer: Medicare Other | Admitting: Cardiovascular Disease

## 2018-06-24 ENCOUNTER — Encounter: Payer: Self-pay | Admitting: Nurse Practitioner

## 2018-06-24 VITALS — BP 128/64 | HR 63 | Ht 66.0 in | Wt 188.2 lb

## 2018-06-24 DIAGNOSIS — I25119 Atherosclerotic heart disease of native coronary artery with unspecified angina pectoris: Secondary | ICD-10-CM | POA: Diagnosis not present

## 2018-06-24 DIAGNOSIS — E785 Hyperlipidemia, unspecified: Secondary | ICD-10-CM | POA: Diagnosis not present

## 2018-06-24 DIAGNOSIS — Z720 Tobacco use: Secondary | ICD-10-CM

## 2018-06-24 DIAGNOSIS — I10 Essential (primary) hypertension: Secondary | ICD-10-CM | POA: Diagnosis not present

## 2018-06-24 NOTE — Progress Notes (Signed)
Office Visit    Patient Name: Rhonda Park Date of Encounter: 06/24/2018  Primary Care Provider:  Armando GangLindley, Cheryl P, FNP Primary Cardiologist:  Lorine BearsMuhammad Arida, MD  Chief Complaint    77 year old female with history of CAD status post RCA stenting, diastolic dysfunction, hypertension, hyperlipidemia, diet-controlled diabetes, hypothyroidism, tobacco abuse, COPD, and prior cerebellar stroke, who presents for follow-up of chest pain.  Past Medical History    Past Medical History:  Diagnosis Date  . Anemia   . Anxiety   . Arthritis   . Cardiomegaly   . 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 Cervicalgia   . Chronic airway obstruction, not elsewhere classified   . Coronary artery disease    a. Remote RCA stent;  b. Most recent cardiac catheterization in May of 2010 showed an EF of 65% with 50% ostial left main stenosis and 90% distal LAD stenosis close to the apex;  c. 11/2015 Low risk MV w/ subtle antlat ischemia; d. 08/2017 MV: No ischemia/infarct. Low Risk. EF 55-65%.  . Diastolic dysfunction    a. 11/2015 Echo: EF 60-65%, Gr1 DD, mild AI, midly dil LA.  . DOE (dyspnea on exertion)    a. With wheezing - does not like to use inhalers.  . Essential hypertension    a. With evidence of hyperaldosteronism with severe hypokalemia. 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  Marland Kitchen. GERD (gastroesophageal reflux disease)   . Headache(784.0)   . History of blood transfusion    30 units child birth  . Hyperlipidemia   . Hypothyroidism   . Type II or unspecified type diabetes mellitus without mention of complication, not stated as uncontrolled    Not on medications  . UC (ulcerative colitis) (HCC)   . Unspecified congenital cystic kidney disease    Past Surgical History:  Procedure Laterality Date  . ABDOMINAL HYSTERECTOMY    . ANTERIOR CERVICAL DECOMP/DISCECTOMY FUSION N/A 05/23/2014   Procedure: CERVICAL FIVE SIX ANTERIOR CERVICAL DECOMPRESSION/DISCECTOMY FUSION 1  LEVEL;  Surgeon: Temple PaciniHenry A Pool, MD;  Location: [facility];  Service: Neurosurgery;  Laterality: N/A;  . APPENDECTOMY    . BACK SURGERY    . BREAST CYST EXCISION Left    3  . CARDIAC CATHETERIZATION  03/2009   [facility]  . CARDIAC CATHETERIZATION  05/2007   [facility]  . CARDIAC CATHETERIZATION  05/2009   ''  . CARDIAC CATHETERIZATION  07/2005   ''  . CHOLECYSTECTOMY    . hysterectomy    . KNEE SURGERY Right    "growth"  . LUMBAR LAMINECTOMY/DECOMPRESSION MICRODISCECTOMY Right 11/30/2015   Procedure: LUMBAR LAMINECTOMY/DECOMPRESSION MICRODISCECTOMY 1 LEVEL;  Surgeon: Julio SicksHenry Pool, MD;  Location: [facility];  Service: Neurosurgery;  Laterality: Right;  . posterior segment     unlisted procedure  . TONSILLECTOMY      Allergies  Allergies  Allergen Reactions  . Demerol [Meperidine] Other (See Comments)    Other Reaction: CNS Disorder  . Codeine     Reports that she gets jittery and does not feel right able to tolerate hydrocodone  . Ivp Dye [Iodinated Diagnostic Agents] Hives  . Shellfish Allergy Hives  . Statins Other (See Comments)    myalgia myalgia    History of Present Illness    77 year old female with the above complex past medical history including CAD status post remote right coronary artery stenting with subsequent catheterization in 2014 revealing apical LAD disease and patent RCA stent.  She underwent stress testing January  2017 for chest discomfort, which was low risk.  More recently, she underwent stress testing in October 2018 for chest discomfort and this was again nonischemic.  Other history includes hypertension, hyperlipidemia, diet-controlled diabetes, obesity, cerebellar stroke, diastolic dysfunction, ongoing tobacco abuse, and COPD.  She was in her usual state of health until about 5 to 6 weeks ago, when she went for a refill on her Spironolactone and it was refilled with a different generic than what she had been accustomed to taking 2.  She said in that setting, she  noted reduced urine output, higher blood pressures, and some abdominal bloating.  Just within the past week or so, she was able to get back on the generic version that she has historically tolerated and done well with.  2 days ago, she says she did a lot of walking around and had significant back discomfort.  This is a chronic issue.  Later in the evening, shortly after walking her dog, she noted intermittent/fleeting chest discomfort which completely resolved after about an hour of intermittent symptoms.  There were no associated symptoms.  Blood pressure was elevated during that time.  Blood pressures have since improved and she has had no recurrent symptoms.  She denies dyspnea, palpitations, PND, orthopnea, dizziness, syncope, edema, or early satiety.  Home Medications    Prior to Admission medications   Medication Sig Start Date End Date Taking? Authorizing Provider  amLODipine (NORVASC) 10 MG tablet Take 10 mg by mouth daily.    [provider]  b complex vitamins tablet Take 1 tablet by mouth daily.    [provider]  Cholecalciferol (VITAMIN D-3) 5000 UNITS TABS Take 5,000 Units by mouth daily.     [provider]  clonazePAM (KLONOPIN) 1 MG tablet Take 1 mg by mouth 2 (two) times daily.    [provider]  clopidogrel (PLAVIX) 75 MG tablet Take 1 tablet (75 mg total) by mouth daily with breakfast. 05/29/14   Iran Ouch, MD  ezetimibe (ZETIA) 10 MG tablet Take 10 mg by mouth daily.    [provider]  fluticasone furoate-vilanterol (BREO ELLIPTA) 100-25 MCG/INH AEPB Inhale 1 puff into the lungs daily.    [provider]  folic acid (FOLVITE) 400 MCG tablet Take 400 mcg by mouth daily.    [provider]  isosorbide mononitrate (IMDUR) 60 MG 24 hr tablet Take 1.5 tablets (90 mg total) by mouth daily. 10/24/14   Sondra Barges, PA-C  levothyroxine (SYNTHROID, LEVOTHROID) 100 MCG tablet Take 100 mcg by mouth daily before  breakfast.    [provider]  metoprolol succinate (TOPROL-XL) 100 MG 24 hr tablet Take 50 mg by mouth daily. Take with or immediately following a meal.    [provider]  sertraline (ZOLOFT) 100 MG tablet Take 100 mg by mouth 2 (two) times daily.     [provider]  spironolactone (ALDACTONE) 25 MG tablet Take 25 mg by mouth 2 (two) times daily.     [provider]    Review of Systems    Back pain and intermittent fleeting chest discomfort the other day.  She also had some abdominal bloating and reduced urine output after her spironolactone was switched from one generic to another.  This is since improved.  All other systems reviewed and are otherwise negative except as noted above.  Physical Exam    VS:  BP 128/64 (BP Location: Left Arm, Patient Position: Sitting, Cuff Size: Normal)   Pulse  63   Ht 5\' 6"  (1.676 m)   Wt 188 lb 4 oz (85.4 kg)   BMI 30.38 kg/m  , BMI Body mass index is 30.38 kg/m. GEN: Well nourished, well developed, in no acute distress.  HEENT: normal.  Neck: Supple, no JVD, carotid bruits, or masses. Cardiac: RRR, no murmurs, rubs, or gallops. No clubbing, cyanosis, edema.  Radials/DP/PT 2+ and equal bilaterally.  Respiratory:  Respirations regular and unlabored, clear to auscultation bilaterally. GI: Soft, nontender, nondistended, BS + x 4. MS: no deformity or atrophy. Skin: warm and dry, no rash. Neuro:  Strength and sensation are intact. Psych: Normal affect.  Accessory Clinical Findings    ECG -regular sinus rhythm, 63 nonspecific ST and T changes, similar to prior ECG.  Assessment & Plan    1.  Chest pain/coronary artery disease: Status post prior right coronary artery stenting with catheterization in 2010 showing apical LAD disease and patent RCA stent.  Low risk Myoview's in January 2017 and again in October 2018.  She had intermittent, fleeting chest discomfort 2 nights ago in the setting of significant back pain  after walking a fair amount earlier that day.  Her blood pressure was elevated during discomfort and resolved after about an hour.  Blood pressures have since improved and she has not had any recurrent symptoms.  She suspects that switching from one form of spironolactone to another generic version recently is the culprit for elevated pressures and since getting back on the generic which she knows she tolerates, pressures and symptoms have improved.  Given somewhat atypical symptoms with low risk stress testing in October 2018, we have agreed to forego any testing at this time and continue to watch symptoms.  She is in favor of this plan as she says she does not like stress test.  She remains on Plavix, Zetia, nitrate, beta-blocker, spironolactone, and calcium channel blocker.  2.  Essential hypertension: She did have elevations while on a different generic version of spironolactone.  Now stable on beta-blocker, calcium channel blocker, nitrate, and spinal lactone.  3.  Hyperlipidemia: Intolerant to statins.  She is on Zetia and this is followed by primary care.  She believes she had labs recently.  We do not have these available to us.  4.  Diet-controlled diabetes mellitus: Followed by primary care.  5.  Disposition: Follow-up in 6 months or sooner if necessary.  Nicolasa Duckinghristopher Deloss Amico, NP 06/24/2018, 2:34 PM

## 2018-06-24 NOTE — Patient Instructions (Signed)
Medication Instructions:  Your physician recommends that you continue on your current medications as directed. Please refer to the Current Medication list given to you today.   Labwork: none  Testing/Procedures: none  Follow-Up: Your physician recommends that you schedule a follow-up appointment in: 6 MONTHS WITH DR ARIDA.   If you need a refill on your cardiac medications before your next appointment, please call your pharmacy.   

## 2018-06-24 NOTE — Addendum Note (Signed)
Addended by: Aurelio JewWOODWARD, KATHY M on: 06/24/2018 03:12 PM   Modules accepted: Orders

## 2018-09-09 ENCOUNTER — Ambulatory Visit: Payer: Self-pay | Admitting: Podiatry

## 2018-12-21 ENCOUNTER — Ambulatory Visit: Payer: Self-pay | Admitting: Cardiovascular Disease

## 2019-02-17 ENCOUNTER — Telehealth: Payer: Self-pay | Admitting: *Deleted

## 2019-02-17 NOTE — Telephone Encounter (Signed)
Left a message to call back about switching her visit to a web visit

## 2019-02-17 NOTE — Telephone Encounter (Signed)
Left a message for the patient to call back to reschedule to set up a web visit.

## 2019-02-18 ENCOUNTER — Encounter: Payer: Self-pay | Admitting: Cardiovascular Disease

## 2019-02-18 ENCOUNTER — Telehealth: Payer: Self-pay

## 2019-02-18 ENCOUNTER — Other Ambulatory Visit: Payer: Self-pay

## 2019-02-18 ENCOUNTER — Telehealth (INDEPENDENT_AMBULATORY_CARE_PROVIDER_SITE_OTHER): Payer: Medicare Other | Admitting: Cardiovascular Disease

## 2019-02-18 VITALS — BP 120/70 | Ht 66.0 in | Wt 183.0 lb

## 2019-02-18 DIAGNOSIS — E785 Hyperlipidemia, unspecified: Secondary | ICD-10-CM

## 2019-02-18 DIAGNOSIS — I25118 Atherosclerotic heart disease of native coronary artery with other forms of angina pectoris: Secondary | ICD-10-CM | POA: Diagnosis not present

## 2019-02-18 DIAGNOSIS — I10 Essential (primary) hypertension: Secondary | ICD-10-CM

## 2019-02-18 NOTE — Telephone Encounter (Addendum)
TELEPHONE VISIT CONSENT OBTAINED VERBALLY   YOUR CARDIOLOGY TEAM HAS ARRANGED FOR AN E-VISIT FOR YOUR APPOINTMENT - PLEASE REVIEW IMPORTANT INFORMATION BELOW SEVERAL DAYS PRIOR TO YOUR APPOINTMENT  Due to the recent COVID-19 pandemic, we are transitioning in-person office visits to tele-medicine visits in an effort to decrease unnecessary exposure to our patients and staff. Medicare and most insurances are covering these visits without a copay needed. We also encourage you to sign up for MyChart if you have not already done so. You will need a smartphone if possible. For patients that do not have this, we can still complete the visit using a regular telephone but do prefer a smartphone to enable video when possible. You may have a close family member that lives with you that can help. If possible, we also ask that you have a blood pressure cuff and scale at home to measure your blood pressure, heart rate and weight prior to your scheduled appointment. Patients with clinical needs that need an in-person evaluation and testing will still be able to come to the office if absolutely necessary. If you have any questions, feel free to call our office.    IF YOU HAVE A SMARTPHONE, PLEASE DOWNLOAD THE WEBEX APP TO YOUR SMARTPHONE  - If Apple, go to App Store and type in WebEx in the search bar. Download [employer] Webex Meetings, the blue/green circle. The app is free but as with any other app download, your phone may require you to verify saved payment information or Apple password. You do NOT have to create a WebEx account.  - If Android, go to Google Play Store and type in WebEx in the search bar. Download [employer] Webex Meetings, the blue/green circle. The app is free but as with any other app download, your phone may require you to verify saved payment information or Android password. You do NOT have to create a WebEx account.  It is very helpful to have this downloaded before your visit.    2-3 DAYS BEFORE  YOUR APPOINTMENT  You will receive a telephone call from one of our [facility] team members - your caller ID may say "Unknown caller." If this is a video visit, we will confirm that you have been able to download the WebEx app. We will remind you check your blood pressure, heart rate and weight prior to your scheduled appointment. If you have an Apple Watch or Kardia, please upload any pertinent ECG strips the day before or morning of your appointment to MyChart. Our staff will also make sure you have reviewed the consent and agree to move forward with your scheduled tele-health visit.     THE DAY OF YOUR APPOINTMENT  Approximately 15 minutes prior to your scheduled appointment, you will receive a telephone call from one of Heart[facility] team - your caller ID may say "Unknown caller."  Our staff will confirm medications, vital signs for the day and any symptoms you may be experiencing. Please have this information available prior to the time of visit start. It may also be helpful for you to have a pad of paper and pen handy for any instructions given during your visit. They will also walk you through joining the WebEx smartphone meeting if this is a video visit.    CONSENT FOR TELE-HEALTH VISIT - PLEASE REVIEW  I hereby voluntarily request, consent and authorize [facility] and its employed or contracted physicians, physician assistants, nurse practitioners or other licensed health care professionals (the Practitioner), to provide me with   telemedicine health care services (the "Services") as deemed necessary by the treating Practitioner. I acknowledge and consent to receive the Services by the Practitioner via telemedicine. I understand that the telemedicine visit will involve communicating with the Practitioner through live audiovisual communication technology and the disclosure of certain medical information by electronic transmission. I acknowledge that I have been given the opportunity to request an  in-person assessment or other available alternative prior to the telemedicine visit and am voluntarily participating in the telemedicine visit.  I understand that I have the right to withhold or withdraw my consent to the use of telemedicine in the course of my care at any time, without affecting my right to future care or treatment, and that the Practitioner or I may terminate the telemedicine visit at any time. I understand that I have the right to inspect all information obtained and/or recorded in the course of the telemedicine visit and may receive copies of available information for a reasonable fee.  I understand that some of the potential risks of receiving the Services via telemedicine include:  . Delay or interruption in medical evaluation due to technological equipment failure or disruption; . Information transmitted may not be sufficient (e.g. poor resolution of images) to allow for appropriate medical decision making by the Practitioner; and/or  . In rare instances, security protocols could fail, causing a breach of personal health information.  Furthermore, I acknowledge that it is my responsibility to provide information about my medical history, conditions and care that is complete and accurate to the best of my ability. I acknowledge that Practitioner's advice, recommendations, and/or decision may be based on factors not within their control, such as incomplete or inaccurate data provided by me or distortions of diagnostic images or specimens that may result from electronic transmissions. I understand that the practice of medicine is not an exact science and that Practitioner makes no warranties or guarantees regarding treatment outcomes. I acknowledge that I will receive a copy of this consent concurrently upon execution via email to the email address I last provided but may also request a printed copy by calling the office of [facility].    I understand that my insurance will be  billed for this visit.   I have read or had this consent read to me. . I understand the contents of this consent, which adequately explains the benefits and risks of the Services being provided via telemedicine.  . I have been provided ample opportunity to ask questions regarding this consent and the Services and have had my questions answered to my satisfaction. . I give my informed consent for the services to be provided through the use of telemedicine in my medical care  By participating in this telemedicine visit I agree to the above. YES  

## 2019-02-18 NOTE — Patient Instructions (Signed)
Medication Instructions:  Continue same medications If you need a refill on your cardiac medications before your next appointment, please call your pharmacy.   Lab work: None If you have labs (blood work) drawn today and your tests are completely normal, you will receive your results only by: . MyChart Message (if you have MyChart) OR . A paper copy in the mail If you have any lab test that is abnormal or we need to change your treatment, we will call you to review the results.  Testing/Procedures: None  Follow-Up: At [facility], you and your health needs are our priority.  As part of our continuing mission to provide you with exceptional heart care, we have created designated Provider Care Teams.  These Care Teams include your primary Cardiologist (physician) and Advanced Practice Providers (APPs -  Physician Assistants and Nurse Practitioners) who all work together to provide you with the care you need, when you need it. You will need a follow up appointment in 6 months.  Please call our office 2 months in advance to schedule this appointment.  You may see Jayanna Kroeger, MD or one of the following Advanced Practice Providers on your designated Care Team:   Christopher Berge, NP Ryan Dunn, PA-C . Jacquelyn Visser, PA-C   

## 2019-02-18 NOTE — Progress Notes (Signed)
Virtual Visit via Telephone Note   This visit type was conducted due to national recommendations for restrictions regarding the COVID-19 Pandemic (e.g. social distancing) in an effort to limit this patient's exposure and mitigate transmission in our community.  Due to her co-morbid illnesses, this patient is at least at moderate risk for complications without adequate follow up.  This format is felt to be most appropriate for this patient at this time.  The patient did not have access to video technology/had technical difficulties with video requiring transitioning to audio format only (telephone).  All issues noted in this document were discussed and addressed.  No physical exam could be performed with this format.  Please refer to the patient's chart for her  consent to telehealth for [facility].   Evaluation Performed:  Follow-up visit  Date:  02/18/2019   ID:  Rhonda, Park [birth year], MRN [id number]  Patient Location: Home  Provider Location: Office  PCP:  Armando Gang, FNP  Cardiologist:  Lorine Bears, MD  Electrophysiologist:  None   Chief Complaint: Routine follow-up visit.  History of Present Illness:    Rhonda Park is a 78 y.o. female who presents via audio/video conferencing for a telehealth visit today.    She has known history of coronary artery disease with previous stenting of the right coronary artery. Most recent cardiac catheterization in 2010 showed moderate ostial left main stenosis with 90% distal LAD stenosis close to the apex and mild instent restenosis in the right coronary artery. She has known history of refractory hypertension with previous evidence of hyperaldosteronism with severe hypokalemia. Imaging was suggestive of adrenal adenoma. She has been treated successfully with spironolactone. She had previous stroke in 2014.  Other medical problems include hyperlipidemia, diet-controlled diabetes, hypothyroidism, tobacco use and COPD.  She  had multiple back surgeries.  She has been doing well overall with no recent chest pain or shortness of breath.  Blood pressure is well controlled on current medication.  She reports being ill in January and February with a viral illness but she improved and she is currently asymptomatic.  The patient does not have symptoms concerning for COVID-19 infection (fever, chills, cough, or new shortness of breath).    Past Medical History:  Diagnosis Date  . Anemia   . Anxiety   . Arthritis   . Cardiomegaly   . 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 Cervicalgia   . Chronic airway obstruction, not elsewhere classified   . Coronary artery disease    a. Remote RCA stent;  b. Most recent cardiac catheterization in May of 2010 showed an EF of 65% with 50% ostial left main stenosis and 90% distal LAD stenosis close to the apex;  c. 11/2015 Low risk MV w/ subtle antlat ischemia; d. 08/2017 MV: No ischemia/infarct. Low Risk. EF 55-65%.  . Diastolic dysfunction    a. 11/2015 Echo: EF 60-65%, Gr1 DD, mild AI, midly dil LA.  . DOE (dyspnea on exertion)    a. With wheezing - does not like to use inhalers.  . Essential hypertension    a. With evidence of hyperaldosteronism with severe hypokalemia. 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  Marland Kitchen GERD (gastroesophageal reflux disease)   . Headache(784.0)   . History of blood transfusion    30 units child birth  . Hyperlipidemia   . Hypothyroidism   . Type II or unspecified type diabetes mellitus without mention of complication, not stated as uncontrolled  Not on medications  . UC (ulcerative colitis) (HCC)   . Unspecified congenital cystic kidney disease    Past Surgical History:  Procedure Laterality Date  . ABDOMINAL HYSTERECTOMY    . ANTERIOR CERVICAL DECOMP/DISCECTOMY FUSION N/A 05/23/2014   Procedure: CERVICAL FIVE SIX ANTERIOR CERVICAL DECOMPRESSION/DISCECTOMY FUSION 1 LEVEL;  Surgeon: Temple PaciniHenry A Pool, MD;  Location: [facility];   Service: Neurosurgery;  Laterality: N/A;  . APPENDECTOMY    . BACK SURGERY    . BREAST CYST EXCISION Left    3  . CARDIAC CATHETERIZATION  03/2009   [facility]  . CARDIAC CATHETERIZATION  05/2007   [facility]  . CARDIAC CATHETERIZATION  05/2009   ''  . CARDIAC CATHETERIZATION  07/2005   ''  . CHOLECYSTECTOMY    . hysterectomy    . KNEE SURGERY Right    "growth"  . LUMBAR LAMINECTOMY/DECOMPRESSION MICRODISCECTOMY Right 11/30/2015   Procedure: LUMBAR LAMINECTOMY/DECOMPRESSION MICRODISCECTOMY 1 LEVEL;  Surgeon: Julio SicksHenry Pool, MD;  Location: [facility];  Service: Neurosurgery;  Laterality: Right;  . posterior segment     unlisted procedure  . TONSILLECTOMY       Current Meds  Medication Sig  . amLODipine (NORVASC) 10 MG tablet Take 10 mg by mouth daily.  Marland Kitchen. b complex vitamins tablet Take 1 tablet by mouth daily.  . Cholecalciferol (VITAMIN D-3) 5000 UNITS TABS Take 5,000 Units by mouth daily.   . clonazePAM (KLONOPIN) 1 MG tablet Take 1 mg by mouth 2 (two) times daily.  . clopidogrel (PLAVIX) 75 MG tablet Take 1 tablet (75 mg total) by mouth daily with breakfast.  . fluticasone furoate-vilanterol (BREO ELLIPTA) 100-25 MCG/INH AEPB Inhale 1 puff into the lungs daily.  . isosorbide mononitrate (IMDUR) 60 MG 24 hr tablet Take 1.5 tablets (90 mg total) by mouth daily.  Marland Kitchen. JANUVIA 100 MG tablet Take 100 mg by mouth daily.   Marland Kitchen. levothyroxine (SYNTHROID, LEVOTHROID) 100 MCG tablet Take 100 mcg by mouth daily before breakfast.  . metoprolol succinate (TOPROL-XL) 50 MG 24 hr tablet Take 50 mg by mouth daily. Take with or immediately following a meal.  . sertraline (ZOLOFT) 100 MG tablet Take 100 mg by mouth 2 (two) times daily.   Marland Kitchen. spironolactone (ALDACTONE) 25 MG tablet Take 25 mg by mouth 2 (two) times daily.      Allergies:   Demerol [meperidine]; Codeine; Demerol  [meperidine hcl]; Ivp dye [iodinated diagnostic agents]; Shellfish allergy; and Statins   Social History   Tobacco Use  . Smoking status:  Current Every Day Smoker    Packs/day: 0.02    Types: Cigarettes  . Smokeless tobacco: Never Used  Substance Use Topics  . Alcohol use: No  . Drug use: No     Family Hx: The patient's family history includes Diabetes in her brother, maternal grandfather, maternal grandmother, mother, and sister; Heart attack in her mother; Hypertension in her brother, father, mother, sister, and son; Stroke in her mother and son.  ROS:   Please see the history of present illness.     All other systems reviewed and are negative.   Prior CV studies:   The following studies were reviewed today:    Labs/Other Tests and Data Reviewed:    EKG:  No ECG reviewed.  Recent Labs: 03/04/2018: ALT 29; BUN 13; Creatinine, Ser 1.06; Hemoglobin 13.8; Platelets 188; Potassium 4.6; Sodium 137; TSH 1.612   Recent Lipid Panel Lab Results  Component Value Date/Time   CHOL 206 (H) 10/13/2013 06:24 AM  TRIG 180 10/13/2013 06:24 AM   HDL 46 10/13/2013 06:24 AM   LDLCALC 124 (H) 10/13/2013 06:24 AM    Wt Readings from Last 3 Encounters:  02/18/19 183 lb (83 kg)  06/24/18 188 lb 4 oz (85.4 kg)  03/04/18 188 lb (85.3 kg)     Objective:    Vital Signs:  BP 120/70   Ht 5\' 6"  (1.676 m)   Wt 183 lb (83 kg)   BMI 29.54 kg/m      ASSESSMENT & PLAN:    1. Coronary artery disease involving native coronary arteries with other forms of angina: She currently reports no anginal symptoms on metoprolol, amlodipine and long-acting nitroglycerin.  2.  Essential hypertension: Blood pressure is well controlled on current medications.  3.  Hyperlipidemia: Intolerant to statins.  She is supposed to be on Zetia but I do not see that on her list today.  We should consider checking lipid profile in the near future and consider treatment with a PCSK9 inhibitor depending on her LDL level.  COVID-19 Education: The signs and symptoms of COVID-19 were discussed with the patient and how to seek care for testing (follow up  with PCP or arrange E-visit).  The importance of social distancing was discussed today.  Time:   Today, I have spent 18 minutes with the patient with telehealth technology discussing the above problems.     Medication Adjustments/Labs and Tests Ordered: Current medicines are reviewed at length with the patient today.  Concerns regarding medicines are outlined above.  Tests Ordered: No orders of the defined types were placed in this encounter.  Medication Changes: No orders of the defined types were placed in this encounter.   Disposition:  Follow up in 6 month(s)  Signed, Lorine Bears, MD  02/18/2019 3:05 PM    [facility]

## 2019-04-21 ENCOUNTER — Other Ambulatory Visit: Payer: Self-pay | Admitting: *Deleted

## 2019-04-21 ENCOUNTER — Telehealth: Payer: Self-pay | Admitting: Cardiovascular Disease

## 2019-04-21 MED ORDER — AMLODIPINE BESYLATE 10 MG PO TABS: 10.0000 mg | ORAL_TABLET | Freq: Every day | ORAL | 0 refills | Status: DC

## 2019-04-21 MED ORDER — CLOPIDOGREL BISULFATE 75 MG PO TABS: 75.0000 mg | ORAL_TABLET | Freq: Every day | ORAL | 0 refills | Status: DC

## 2019-04-21 NOTE — Telephone Encounter (Signed)
Ok to refill Amlodipine and Clopidogrel. Request for Clonazepam should be fwd to the pt pcp.

## 2019-04-21 NOTE — Telephone Encounter (Signed)
Please advise if ok to refill medications listed below: Amlodipine 10 mg qd Clopidogrel 75 mg qd Clonazepam 1 mg BID not cardiac med.

## 2019-04-21 NOTE — Telephone Encounter (Signed)
Refill sent for amlodipine 10 mg and clopidogrel 75 mg qd to [facility]. Pt made aware to contact pcp for clonazepam request.

## 2019-04-21 NOTE — Telephone Encounter (Signed)
°*  STAT* If patient is at the pharmacy, call can be transferred to refill team.   1. Which medications need to be refilled? (please list name of each medication and dose if known)    Clonazepam 1 mg po BID    norvasc 10 mg po q d    plavix 75 mg po q d               2. Which pharmacy/location (including street and city if local pharmacy) is medication to be sent to?    [facility] [address]   3. Do they need a 30 day or 90 day supply? 90  Per Patient pcp wants her to come to the office and she is not comfortable with this due to covid she states office does not have proper ppe and she got sick at the last ov

## 2019-09-28 ENCOUNTER — Other Ambulatory Visit: Payer: Self-pay

## 2019-09-28 MED ORDER — AMLODIPINE BESYLATE 10 MG PO TABS: 10.0000 mg | ORAL_TABLET | Freq: Every day | ORAL | 0 refills | Status: DC

## 2019-11-18 ENCOUNTER — Other Ambulatory Visit: Payer: Self-pay

## 2019-11-18 ENCOUNTER — Encounter: Payer: Self-pay | Admitting: Cardiovascular Disease

## 2019-11-18 ENCOUNTER — Ambulatory Visit: Payer: Medicare Other | Admitting: Physician Assistant

## 2019-11-18 ENCOUNTER — Telehealth: Payer: Self-pay | Admitting: Cardiovascular Disease

## 2019-11-18 ENCOUNTER — Telehealth (INDEPENDENT_AMBULATORY_CARE_PROVIDER_SITE_OTHER): Payer: Medicare Other | Admitting: Cardiovascular Disease

## 2019-11-18 VITALS — Ht 66.0 in | Wt 171.0 lb

## 2019-11-18 DIAGNOSIS — I2510 Atherosclerotic heart disease of native coronary artery without angina pectoris: Secondary | ICD-10-CM | POA: Diagnosis not present

## 2019-11-18 DIAGNOSIS — I10 Essential (primary) hypertension: Secondary | ICD-10-CM

## 2019-11-18 DIAGNOSIS — E785 Hyperlipidemia, unspecified: Secondary | ICD-10-CM

## 2019-11-18 MED ORDER — ALBUTEROL SULFATE HFA 108 (90 BASE) MCG/ACT IN AERS: 1.0000 | INHALATION_SPRAY | Freq: Four times a day (QID) | RESPIRATORY_TRACT | 1 refills | Status: DC | PRN

## 2019-11-18 NOTE — Progress Notes (Signed)
Virtual Visit via Telephone Note   This visit type was conducted due to national recommendations for restrictions regarding the COVID-19 Pandemic (e.g. social distancing) in an effort to limit this patient's exposure and mitigate transmission in our community.  Due to her co-morbid illnesses, this patient is at least at moderate risk for complications without adequate follow up.  This format is felt to be most appropriate for this patient at this time.  The patient did not have access to video technology/had technical difficulties with video requiring transitioning to audio format only (telephone).  All issues noted in this document were discussed and addressed.  No physical exam could be performed with this format.  Please refer to the patient's chart for her  consent to telehealth for [facility].   Evaluation Performed:  Follow-up visit  Date:  11/18/2019   ID:  Rhonda Park, Rhonda Park 1941/08/06, MRN [id number]  Patient Location: Home  Provider Location: Office  PCP:  Remi Haggard, FNP  Cardiologist:  Kathlyn Sacramento, MD  Electrophysiologist:  None   Chief Complaint: Routine follow-up visit.  History of Present Illness:    Rhonda Park is a 79 y.o. female who presents via audio/video conferencing for a telehealth visit today.    She has known history of coronary artery disease with previous stenting of the right coronary artery. Most recent cardiac catheterization in 2010 showed moderate ostial left main stenosis with 90% distal LAD stenosis close to the apex and mild instent restenosis in the right coronary artery. She has known history of refractory hypertension with previous evidence of hyperaldosteronism with severe hypokalemia. Imaging was suggestive of adrenal adenoma. She has been treated successfully with spironolactone. She had previous stroke in 2014.  Other medical problems include hyperlipidemia, diet-controlled diabetes, hypothyroidism, tobacco use and COPD.  She had  multiple back surgeries.  She has been doing well overall with no recent chest pain or shortness of breath.  Blood pressure is well controlled on current medication.   She might have to switch primary care physician due to insurance reasons.  The patient does not have symptoms concerning for COVID-19 infection (fever, chills, cough, or new shortness of breath).    Past Medical History:  Diagnosis Date  . Anemia   . Anxiety   . Arthritis   . Cardiomegaly   . Cerebellar stroke ([place]) 10/2013   memory loss  . Cervicalgia   . Chronic airway obstruction, not elsewhere classified   . Coronary artery disease    a. Remote RCA stent;  b. Most recent cardiac catheterization in May of 2010 showed an EF of 65% with 50% ostial left main stenosis and 90% distal LAD stenosis close to the apex;  c. 11/2015 Low risk MV w/ subtle antlat ischemia; d. 08/2017 MV: No ischemia/infarct. Low Risk. EF 55-65%.  . Diastolic dysfunction    a. 11/2015 Echo: EF 60-65%, Gr1 DD, mild AI, midly dil LA.  . DOE (dyspnea on exertion)    a. With wheezing - does not like to use inhalers.  . Essential hypertension    a. With evidence of hyperaldosteronism with severe hypokalemia. 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  Marland Kitchen GERD (gastroesophageal reflux disease)   . Headache(784.0)   . History of blood transfusion    30 units child birth  . Hyperlipidemia   . Hypothyroidism   . Type II or unspecified type diabetes mellitus without mention of complication, not stated as uncontrolled    Not on medications  . UC (  ulcerative colitis) (HCC)   . Unspecified congenital cystic kidney disease    Past Surgical History:  Procedure Laterality Date  . ABDOMINAL HYSTERECTOMY    . ANTERIOR CERVICAL DECOMP/DISCECTOMY FUSION N/A 05/23/2014   Procedure: CERVICAL FIVE SIX ANTERIOR CERVICAL DECOMPRESSION/DISCECTOMY FUSION 1 LEVEL;  Surgeon: Temple Pacini, MD;  Location: [facility];  Service: Neurosurgery;  Laterality: N/A;  .  APPENDECTOMY    . BACK SURGERY    . BREAST CYST EXCISION Left    3  . CARDIAC CATHETERIZATION  03/2009   [facility]  . CARDIAC CATHETERIZATION  05/2007   [facility]  . CARDIAC CATHETERIZATION  05/2009   ''  . CARDIAC CATHETERIZATION  07/2005   ''  . CHOLECYSTECTOMY    . hysterectomy    . KNEE SURGERY Right    "growth"  . LUMBAR LAMINECTOMY/DECOMPRESSION MICRODISCECTOMY Right 11/30/2015   Procedure: LUMBAR LAMINECTOMY/DECOMPRESSION MICRODISCECTOMY 1 LEVEL;  Surgeon: Julio Sicks, MD;  Location: [facility];  Service: Neurosurgery;  Laterality: Right;  . posterior segment     unlisted procedure  . TONSILLECTOMY       Current Meds  Medication Sig  . amLODipine (NORVASC) 10 MG tablet Take 1 tablet (10 mg total) by mouth daily.  Marland Kitchen b complex vitamins tablet Take 1 tablet by mouth daily.  . Cholecalciferol (VITAMIN D-3) 5000 UNITS TABS Take 5,000 Units by mouth daily.   . clonazePAM (KLONOPIN) 1 MG tablet Take 1 mg by mouth 2 (two) times daily.  . clopidogrel (PLAVIX) 75 MG tablet Take 1 tablet (75 mg total) by mouth daily with breakfast.  . isosorbide mononitrate (IMDUR) 60 MG 24 hr tablet Take 1.5 tablets (90 mg total) by mouth daily.  Marland Kitchen JANUVIA 100 MG tablet Take 100 mg by mouth daily.   Marland Kitchen levothyroxine (SYNTHROID, LEVOTHROID) 100 MCG tablet Take 100 mcg by mouth daily before breakfast.  . metoprolol succinate (TOPROL-XL) 50 MG 24 hr tablet Take 50 mg by mouth daily. Take with or immediately following a meal.  . sertraline (ZOLOFT) 100 MG tablet Take 100 mg by mouth 2 (two) times daily.   Marland Kitchen spironolactone (ALDACTONE) 25 MG tablet Take 25 mg by mouth 2 (two) times daily.      Allergies:   Breo ellipta [fluticasone furoate-vilanterol], Demerol [meperidine], Codeine, Ivp dye [iodinated diagnostic agents], Shellfish allergy, and Statins   Social History   Tobacco Use  . Smoking status: Current Every Day Smoker    Packs/day: 0.02    Types: Cigarettes  . Smokeless tobacco: Never Used  Substance  Use Topics  . Alcohol use: No  . Drug use: No     Family Hx: The patient's family history includes Diabetes in her brother, maternal grandfather, maternal grandmother, mother, and sister; Heart attack in her mother; Hypertension in her brother, father, mother, sister, and son; Stroke in her mother and son.  ROS:   Please see the history of present illness.     All other systems reviewed and are negative.   Prior CV studies:   The following studies were reviewed today:    Labs/Other Tests and Data Reviewed:    EKG:  No ECG reviewed.  Recent Labs: No results found for requested labs within last 8760 hours.   Recent Lipid Panel Lab Results  Component Value Date/Time   CHOL 206 (H) 10/13/2013 06:24 AM   TRIG 180 10/13/2013 06:24 AM   HDL 46 10/13/2013 06:24 AM   LDLCALC 124 (H) 10/13/2013 06:24 AM    Wt Readings  from Last 3 Encounters:  11/18/19 171 lb (77.6 kg)  02/18/19 183 lb (83 kg)  06/24/18 188 lb 4 oz (85.4 kg)     Objective:    Vital Signs:  Ht 5\' 6"  (1.676 m)   Wt 171 lb (77.6 kg) Comment: Estimated by patient  BMI 27.60 kg/m      ASSESSMENT & PLAN:    1. Coronary artery disease involving native coronary arteries with other forms of angina: She currently reports no anginal symptoms on metoprolol, amlodipine and long-acting nitroglycerin.  She continues to be on Plavix with no bleeding issues.  Stenting was not recent and thus I discussed with her the option of switching to low-dose aspirin but it appears that she had a stroke before and might do better with Plavix.  2.  Essential hypertension: Blood pressure is well controlled on current medications.  3.  Hyperlipidemia: Intolerant to statins.  She also reports that she did not tolerate Zetia.  She has improved her diet and wants to continue with that for now.  I asked her to bring her lab results with her to next visit.  If LDL is not at target of less than 70, I recommend treatment with a PCSK9  inhibitor.    4.  Questionable COPD, she uses inhalers and she requested refills on albuterol.   COVID-19 Education: The signs and symptoms of COVID-19 were discussed with the patient and how to seek care for testing (follow up with PCP or arrange E-visit).  The importance of social distancing was discussed today.  Time:   Today, I have spent 10 minutes with the patient with telehealth technology discussing the above problems.     Medication Adjustments/Labs and Tests Ordered: Current medicines are reviewed at length with the patient today.  Concerns regarding medicines are outlined above.  Tests Ordered: No orders of the defined types were placed in this encounter.  Medication Changes: No orders of the defined types were placed in this encounter.   Disposition:  Follow up in 6 month(s)  Signed, , MD  11/18/2019 10:14 AM    [facility]

## 2019-11-18 NOTE — Telephone Encounter (Signed)
l mom to schedule 6 month f/u from checkout 11/18/19

## 2019-11-18 NOTE — Patient Instructions (Signed)
Medication Instructions:  Your physician has recommended you make the following change in your medication:    Albuterol inhaler has been sent to your pharmacy. Use as directed. 1-2 puff every 6  hours as needed for shortness of breath or wheezing.  *If you need a refill on your cardiac medications before your next appointment, please call your pharmacy*  Lab Work: None ordered If you have labs (blood work) drawn today and your tests are completely normal, you will receive your results only by: Marland Kitchen MyChart Message (if you have MyChart) OR . A paper copy in the mail If you have any lab test that is abnormal or we need to change your treatment, we will call you to review the results.  Testing/Procedures: N/A  Follow-Up: At [facility], you and your health needs are our priority.  As part of our continuing mission to provide you with exceptional heart care, we have created designated Provider Care Teams.  These Care Teams include your primary Cardiologist (physician) and Advanced Practice Providers (APPs -  Physician Assistants and Nurse Practitioners) who all work together to provide you with the care you need, when you need it.  Your next appointment:   6 month(s)  The format for your next appointment:   In Person  Provider:    You may see Lorine Bears, MD or one of the following Advanced Practice Providers on your designated Care Team:    Nicolasa Ducking, NP  Eula Listen, PA-C  Marisue Ivan, PA-C   Other Instructions N/A

## 2019-12-26 ENCOUNTER — Other Ambulatory Visit: Payer: Self-pay

## 2019-12-26 MED ORDER — AMLODIPINE BESYLATE 10 MG PO TABS: 10.0000 mg | ORAL_TABLET | Freq: Every day | ORAL | 0 refills | Status: DC

## 2020-01-05 ENCOUNTER — Other Ambulatory Visit: Payer: Self-pay | Admitting: *Deleted

## 2020-01-05 NOTE — Telephone Encounter (Signed)
Yes she should request this from her primary care physician.

## 2020-01-05 NOTE — Telephone Encounter (Signed)
To Dr. Kirke Corin to advise on the patient's refill/ should this be filled by her PCP going forward?

## 2020-02-14 ENCOUNTER — Ambulatory Visit: Payer: Medicare Other | Admitting: Podiatry

## 2020-03-06 ENCOUNTER — Other Ambulatory Visit: Payer: Self-pay

## 2020-03-06 MED ORDER — CLOPIDOGREL BISULFATE 75 MG PO TABS: 75.0000 mg | ORAL_TABLET | Freq: Every day | ORAL | 3 refills | Status: DC

## 2020-03-06 MED ORDER — AMLODIPINE BESYLATE 10 MG PO TABS: 10.0000 mg | ORAL_TABLET | Freq: Every day | ORAL | 3 refills | Status: DC

## 2020-05-17 ENCOUNTER — Ambulatory Visit: Payer: Medicare Other | Admitting: Cardiovascular Disease

## 2020-07-02 ENCOUNTER — Ambulatory Visit: Payer: Medicare Other | Admitting: Podiatry

## 2020-07-09 ENCOUNTER — Ambulatory Visit: Payer: Medicare Other | Admitting: Podiatry

## 2020-09-05 ENCOUNTER — Other Ambulatory Visit
Admission: RE | Admit: 2020-09-05 | Discharge: 2020-09-05 | Disposition: A | Discharge status: Home / Self Care | Payer: Medicare HMO | Attending: Ophthalmology | Admitting: Ophthalmology

## 2020-09-05 DIAGNOSIS — M316 Other giant cell arteritis: Secondary | ICD-10-CM | POA: Diagnosis present

## 2020-09-05 LAB — CBC WITH DIFFERENTIAL/PLATELET
Abs Immature Granulocytes: 0.03 10*3/uL (ref 0.00–0.07)
Basophils Absolute: 0 10*3/uL (ref 0.0–0.1)
Basophils Relative: 1 %
Eosinophils Absolute: 0.1 10*3/uL (ref 0.0–0.5)
Eosinophils Relative: 2 %
HCT: 37.7 % (ref 36.0–46.0)
Hemoglobin: 12.5 g/dL (ref 12.0–15.0)
Immature Granulocytes: 0 %
Lymphocytes Relative: 16 %
Lymphs Abs: 1.1 10*3/uL (ref 0.7–4.0)
MCH: 31.6 pg (ref 26.0–34.0)
MCHC: 33.2 g/dL (ref 30.0–36.0)
MCV: 95.4 fL (ref 80.0–100.0)
Monocytes Absolute: 0.4 10*3/uL (ref 0.1–1.0)
Monocytes Relative: 6 %
Neutro Abs: 5.1 10*3/uL (ref 1.7–7.7)
Neutrophils Relative %: 75 %
Platelets: 195 10*3/uL (ref 150–400)
RBC: 3.95 MIL/uL (ref 3.87–5.11)
RDW: 12.2 % (ref 11.5–15.5)
WBC: 6.8 10*3/uL (ref 4.0–10.5)
nRBC: 0 % (ref 0.0–0.2)

## 2020-09-05 LAB — SEDIMENTATION RATE: Sed Rate: 21 mm/hr (ref 0–30)

## 2020-09-05 LAB — C-REACTIVE PROTEIN: CRP: 0.6 mg/dL (ref <1.0)

## 2020-10-12 ENCOUNTER — Other Ambulatory Visit: Payer: Self-pay | Admitting: Student

## 2020-10-12 DIAGNOSIS — M5416 Radiculopathy, lumbar region: Secondary | ICD-10-CM

## 2020-10-21 ENCOUNTER — Ambulatory Visit: Payer: Medicare HMO

## 2021-06-28 ENCOUNTER — Other Ambulatory Visit (INDEPENDENT_AMBULATORY_CARE_PROVIDER_SITE_OTHER): Payer: Self-pay | Admitting: Vascular Surgery

## 2021-06-28 DIAGNOSIS — I6521 Occlusion and stenosis of right carotid artery: Secondary | ICD-10-CM

## 2021-07-01 ENCOUNTER — Ambulatory Visit (INDEPENDENT_AMBULATORY_CARE_PROVIDER_SITE_OTHER): Payer: Medicare HMO

## 2021-07-01 ENCOUNTER — Encounter (INDEPENDENT_AMBULATORY_CARE_PROVIDER_SITE_OTHER): Payer: Self-pay | Admitting: Nurse Practitioner

## 2021-07-01 ENCOUNTER — Ambulatory Visit (INDEPENDENT_AMBULATORY_CARE_PROVIDER_SITE_OTHER): Payer: Medicare HMO | Admitting: Nurse Practitioner

## 2021-07-01 ENCOUNTER — Other Ambulatory Visit: Payer: Self-pay

## 2021-07-01 VITALS — BP 114/70 | HR 59 | Ht 66.0 in | Wt 169.0 lb

## 2021-07-01 DIAGNOSIS — I6521 Occlusion and stenosis of right carotid artery: Secondary | ICD-10-CM | POA: Diagnosis not present

## 2021-07-01 DIAGNOSIS — I6523 Occlusion and stenosis of bilateral carotid arteries: Secondary | ICD-10-CM

## 2021-07-01 DIAGNOSIS — I10 Essential (primary) hypertension: Secondary | ICD-10-CM

## 2021-07-01 DIAGNOSIS — E785 Hyperlipidemia, unspecified: Secondary | ICD-10-CM | POA: Diagnosis not present

## 2021-07-08 ENCOUNTER — Encounter (INDEPENDENT_AMBULATORY_CARE_PROVIDER_SITE_OTHER): Payer: Self-pay | Admitting: Nurse Practitioner

## 2021-07-08 NOTE — Progress Notes (Signed)
Subjective:    Patient ID: Rhonda Park, female    DOB: [birth year], 80 y.o.   MRN: [id number] Chief Complaint  Patient presents with   New Patient (Initial Visit)    NP and Carotid and consult right carotid stenosis referred by Rhonda Park     Rhonda Park is a 80 year old female that presents today on referral by her primary care physician Rhonda Guy, FNP for evaluation of carotid artery stenosis.  The carotid ultrasound was done with indication for possible TIA-like symptoms.  It noted an 80 to 99% stenosis of the bulb and ICA of the right.  The left had a 1 to 39% stenosis.  The patient denies any amaurosis fugax symptoms.  She denies any explicit TIA-like symptoms.  She denies any fevers or chills.  She denies any claudication or rest pain.  Today noninvasive study showed a 1 to 39% stenosis bilaterally.  Less than 50% noted in the common carotid artery.   Review of Systems  All other systems reviewed and are negative.     Objective:   Physical Exam Vitals reviewed.  HENT:     Head: Normocephalic.  Neck:     Vascular: No carotid bruit.  Cardiovascular:     Rate and Rhythm: Normal rate.     Pulses: Normal pulses.  Pulmonary:     Effort: Pulmonary effort is normal.  Neurological:     Mental Status: She is alert and oriented to person, place, and time.  Psychiatric:        Mood and Affect: Mood normal.        Behavior: Behavior normal.        Thought Content: Thought content normal.        Judgment: Judgment normal.    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 Remote RCA stent;  b. Most recent cardiac catheterization in May of 2010 showed an EF of 65% with 50% ostial left  main stenosis and 90% distal LAD stenosis close to the apex;  c. 11/2015 Low risk MV w/ subtle antlat ischemia; d. 08/2017 MV: No ischemia/infarct. Low Risk. EF 55-65%.   Diastolic dysfunction    a. 11/2015 Echo: EF 60-65%, Gr1 DD, mild AI, midly dil LA.   DOE (dyspnea on exertion)    a. With wheezing - does not like to use inhalers.   Essential hypertension    a. With evidence of hyperaldosteronism with severe hypokalemia. 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   GERD (gastroesophageal reflux disease)    Headache(784.0)    History of blood transfusion    30 units child birth   Hyperlipidemia    Hypothyroidism    Type II or unspecified type diabetes mellitus without mention of complication, not stated as uncontrolled    Not on medications   UC (ulcerative colitis) (HCC)    Unspecified congenital cystic kidney disease     Social History   Socioeconomic History   Marital status: Single    Spouse name: Not on file   Number of children: Not on file   Years  of education: Not on file   Highest education level: Not on file  Occupational History   Not on file  Tobacco Use   Smoking status: Every Day    Packs/day: 0.02    Types: Cigarettes   Smokeless tobacco: Never  Substance and Sexual Activity   Alcohol use: No   Drug use: No   Sexual activity: Not on file  Other Topics Concern   Not on file  Social History Narrative   Not on file   Social Determinants of Health   Financial Resource Strain: Not on file  Food Insecurity: Not on file  Transportation Needs: Not on file  Physical Activity: Not on file  Stress: Not on file  Social Connections: Not on file  Intimate Partner Violence: Not on file    Past Surgical History:  Procedure Laterality Date   ABDOMINAL HYSTERECTOMY     ANTERIOR CERVICAL DECOMP/DISCECTOMY FUSION N/A 05/23/2014   Procedure: CERVICAL FIVE SIX ANTERIOR CERVICAL DECOMPRESSION/DISCECTOMY FUSION 1 LEVEL;  Surgeon: Temple PaciniHenry A Pool, MD;  Location: [facility];  Service: Neurosurgery;  Laterality: N/A;   APPENDECTOMY     BACK SURGERY     BREAST CYST EXCISION Left    3   [facility]   CARDIAC CATHETERIZATION  05/2009   ''   CARDIAC CATHETERIZATION  07/2005   ''   CHOLECYSTECTOMY     hysterectomy     KNEE SURGERY Right    "growth"   LUMBAR LAMINECTOMY/DECOMPRESSION MICRODISCECTOMY Right 11/30/2015   Procedure: LUMBAR LAMINECTOMY/DECOMPRESSION MICRODISCECTOMY 1 LEVEL;  Surgeon: Julio SicksHenry Pool, MD;  Location: [facility];  Service: Neurosurgery;  Laterality: Right;   posterior segment     unlisted procedure   TONSILLECTOMY      Family History  Problem Relation Age of Onset   Heart attack Mother    Diabetes Mother    Hypertension Mother    Stroke Mother    Hypertension Father    Diabetes Maternal Grandmother    Diabetes Maternal Grandfather    Diabetes Sister    Diabetes Brother    Hypertension Sister    Hypertension Brother    Hypertension Son    Stroke Son     Allergies  Allergen Reactions   Breo Ellipta [Fluticasone Furoate-Vilanterol] Palpitations   Demerol [Meperidine] Other (See Comments)    Other Reaction: CNS Disorder   Codeine     Reports that she gets jittery and does not feel right able to tolerate hydrocodone   Ivp Dye [Iodinated Diagnostic Agents] Hives   Shellfish Allergy Hives   Statins Other (See Comments)    myalgia myalgia    CBC Latest Ref Rng & Units 09/05/2020 03/04/2018 11/27/2017  WBC 4.0 - 10.5 K/uL 6.8 7.8 6.5  Hemoglobin 12.0 - 15.0 g/dL 16.112.5 09.613.8 04.513.3  Hematocrit 36.0 - 46.0 % 37.7 40.0 40.8  Platelets 150 - 400 K/uL 195 188 167      CMP     Component Value Date/Time   NA 137 03/04/2018 2052   NA 134 (L) 11/02/2013 1011   NA 135 07/25/2010 0844   K 4.6 03/04/2018 2052   K 4.4 11/02/2013 1011   K 4.7 07/25/2010 0844   CL 102 03/04/2018 2052   CL 100 11/02/2013 1011   CL 97 (L) 07/25/2010 0844   CO2 27  03/04/2018 2052   CO2 31 11/02/2013 1011   CO2 27 07/25/2010 0844  GLUCOSE 108 (H) 03/04/2018 2052   GLUCOSE 120 (H) 11/02/2013 1011   GLUCOSE 137 (H) 07/25/2010 0844   BUN 13 03/04/2018 2052   BUN 22 (H) 11/02/2013 1011   BUN 27 (H) 07/25/2010 0844   CREATININE 1.06 (H) 03/04/2018 2052   CREATININE 1.22 11/02/2013 1011   CREATININE 1.1 07/25/2010 0844   CALCIUM 9.6 03/04/2018 2052   CALCIUM 8.7 11/02/2013 1011   CALCIUM 9.0 07/25/2010 0844   PROT 7.7 03/04/2018 2052   PROT 7.1 11/02/2013 1011   PROT 6.5 06/04/2010 0901   ALBUMIN 5.0 03/04/2018 2052   ALBUMIN 3.7 11/02/2013 1011   AST 28 03/04/2018 2052   AST 32 11/02/2013 1011   AST 23 06/04/2010 0901   ALT 29 03/04/2018 2052   ALT 35 11/02/2013 1011   ALT 29 06/04/2010 0901   ALKPHOS 52 03/04/2018 2052   ALKPHOS 65 11/02/2013 1011   ALKPHOS 67 06/04/2010 0901   BILITOT 0.6 03/04/2018 2052   BILITOT 0.3 11/02/2013 1011   BILITOT 0.50 06/04/2010 0901   GFRNONAA 50 (L) 03/04/2018 2052   GFRNONAA 45 (L) 11/02/2013 1011   GFRAA 58 (L) 03/04/2018 2052   GFRAA 52 (L) 11/02/2013 1011     No results found.     Assessment & Plan:   1. Bilateral carotid artery stenosis Based upon the 2 noninvasive studies the patient has had, there is a drastic discrepancy in the level of carotid artery stenosis.  Her noninvasive studies today show 1 to 39% stenosis however studies done by her primary care office showed 80 to 99% stenosis in the right ICA.  Due to the potentially significant stenosis we will order a CT scan to further evaluate.  Patient will follow-up in office following CT scan - CT ANGIO NECK W OR WO CONTRAST; Future  2. Essential hypertension Continue antihypertensive medications as already ordered, these medications have been reviewed and there are no changes at this time.   3. Hyperlipidemia, unspecified hyperlipidemia type Continue statin as ordered and reviewed, no changes at this time    Current Outpatient  Medications on File Prior to Visit  Medication Sig Dispense Refill   albuterol (VENTOLIN HFA) 108 (90 Base) MCG/ACT inhaler Inhale 1-2 puffs into the lungs every 6 (six) hours as needed for wheezing or shortness of breath. 8 g 1   amLODipine (NORVASC) 10 MG tablet Take 1 tablet (10 mg total) by mouth daily. 90 tablet 3   amLODipine (NORVASC) 10 MG tablet Take by mouth.     b complex vitamins tablet Take 1 tablet by mouth daily.     Cholecalciferol (VITAMIN D-3) 5000 UNITS TABS Take 5,000 Units by mouth daily.      clonazePAM (KLONOPIN) 1 MG tablet Take 1 mg by mouth 2 (two) times daily.     clonazePAM (KLONOPIN) 1 MG tablet Take 1 tablet by mouth 2 (two) times daily as needed.     clopidogrel (PLAVIX) 75 MG tablet Take 1 tablet (75 mg total) by mouth daily with breakfast. 90 tablet 3   fluticasone furoate-vilanterol (BREO ELLIPTA) 100-25 MCG/INH AEPB Inhale 1 puff into the lungs daily.     HYDROcodone-acetaminophen (NORCO/VICODIN) 5-325 MG tablet Take by mouth.     isosorbide mononitrate (IMDUR) 60 MG 24 hr tablet Take 1.5 tablets (90 mg total) by mouth daily. 90 tablet 6   JANUVIA 100 MG tablet Take 100 mg by mouth daily.   1   levothyroxine (SYNTHROID) 100 MCG tablet Take by mouth.  levothyroxine (SYNTHROID, LEVOTHROID) 100 MCG tablet Take 100 mcg by mouth daily before breakfast.     metoprolol succinate (TOPROL-XL) 50 MG 24 hr tablet Take 50 mg by mouth daily. Take with or immediately following a meal.     sertraline (ZOLOFT) 100 MG tablet Take 100 mg by mouth 2 (two) times daily.      sertraline (ZOLOFT) 100 MG tablet Take by mouth.     spironolactone (ALDACTONE) 25 MG tablet Take 25 mg by mouth 2 (two) times daily.      No current facility-administered medications on file prior to visit.    There are no Patient Instructions on file for this visit. No follow-ups on file.   Georgiana Spinner, NP

## 2021-08-07 ENCOUNTER — Ambulatory Visit: Payer: Medicare HMO

## 2021-08-09 ENCOUNTER — Telehealth (INDEPENDENT_AMBULATORY_CARE_PROVIDER_SITE_OTHER): Payer: Self-pay | Admitting: Nurse Practitioner

## 2021-08-12 ENCOUNTER — Ambulatory Visit (INDEPENDENT_AMBULATORY_CARE_PROVIDER_SITE_OTHER): Payer: Medicare Other | Admitting: Vascular Surgery

## 2021-08-22 ENCOUNTER — Other Ambulatory Visit: Payer: Self-pay

## 2021-08-22 ENCOUNTER — Ambulatory Visit
Admission: RE | Admit: 2021-08-22 | Discharge: 2021-08-22 | Disposition: A | Discharge status: Home / Self Care | Payer: Medicare HMO | Source: Ambulatory Visit | Attending: Nurse Practitioner | Admitting: Nurse Practitioner

## 2021-08-22 DIAGNOSIS — I6523 Occlusion and stenosis of bilateral carotid arteries: Secondary | ICD-10-CM | POA: Insufficient documentation

## 2021-08-22 MED ORDER — IOHEXOL 350 MG/ML SOLN
60.0000 mL | Freq: Once | INTRAVENOUS | Status: AC | PRN
  Administered 2021-08-22: 60 mL via INTRAVENOUS

## 2021-09-01 ENCOUNTER — Emergency Department: Payer: Medicare HMO

## 2021-09-01 ENCOUNTER — Other Ambulatory Visit: Payer: Self-pay

## 2021-09-01 ENCOUNTER — Emergency Department
Admission: EM | Admit: 2021-09-01 | Discharge: 2021-09-01 | Disposition: A | Discharge status: Home / Self Care | Payer: Medicare HMO | Attending: Emergency Medicine | Admitting: Emergency Medicine

## 2021-09-01 DIAGNOSIS — M4802 Spinal stenosis, cervical region: Secondary | ICD-10-CM | POA: Diagnosis not present

## 2021-09-01 DIAGNOSIS — Z7902 Long term (current) use of antithrombotics/antiplatelets: Secondary | ICD-10-CM | POA: Diagnosis not present

## 2021-09-01 DIAGNOSIS — Y30XXXA Falling, jumping or pushed from a high place, undetermined intent, initial encounter: Secondary | ICD-10-CM | POA: Diagnosis not present

## 2021-09-01 DIAGNOSIS — M542 Cervicalgia: Secondary | ICD-10-CM | POA: Diagnosis present

## 2021-09-01 DIAGNOSIS — Q7190 Unspecified reduction defect of unspecified upper limb: Secondary | ICD-10-CM

## 2021-09-01 DIAGNOSIS — S52501A Unspecified fracture of the lower end of right radius, initial encounter for closed fracture: Secondary | ICD-10-CM

## 2021-09-01 DIAGNOSIS — S0990XA Unspecified injury of head, initial encounter: Secondary | ICD-10-CM | POA: Diagnosis present

## 2021-09-01 DIAGNOSIS — R519 Headache, unspecified: Secondary | ICD-10-CM | POA: Insufficient documentation

## 2021-09-01 MED ORDER — MIDAZOLAM HCL 5 MG/5ML IJ SOLN
INTRAMUSCULAR | Status: AC | PRN
  Administered 2021-09-01 (×2): 3 mg via INTRAVENOUS

## 2021-09-01 MED ORDER — FENTANYL CITRATE (PF) 100 MCG/2ML IJ SOLN
INTRAMUSCULAR | Status: AC | PRN
  Administered 2021-09-01 (×2): 100 ug via INTRAVENOUS

## 2021-09-01 MED ORDER — MIDAZOLAM HCL 2 MG/2ML IJ SOLN
6.0000 mg | Freq: Once | INTRAMUSCULAR | Status: DC
  Filled 2021-09-01: qty 6

## 2021-09-01 MED ORDER — HYDROCODONE-ACETAMINOPHEN 5-325 MG PO TABS: 1.0000 | ORAL_TABLET | Freq: Four times a day (QID) | ORAL | 0 refills | Status: AC | PRN

## 2021-09-01 MED ORDER — ONDANSETRON 4 MG PO TBDP: 4.0000 mg | ORAL_TABLET | Freq: Three times a day (TID) | ORAL | 0 refills | Status: AC | PRN

## 2021-09-01 MED ORDER — FENTANYL CITRATE (PF) 250 MCG/5ML IJ SOLN
200.0000 ug | Freq: Once | INTRAMUSCULAR | Status: DC
  Filled 2021-09-01: qty 5

## 2021-09-01 NOTE — ED Notes (Signed)
Went to have pt sign consent for moderate sedation and pt states she would not sign unless she was given medication she has had before- spoke with Trinidad PA about pt's concerns- Jacyln to bedside to speak with pt

## 2021-09-01 NOTE — ED Triage Notes (Signed)
Pt arrives via EMS from home for a fall- pt states she fell trying to clean out her closet- pt states that her dog knocked the stool she was on and she fell off- pt has a deformity to her R wrist- pt having pain "everywhere"- pt denies LOC- pt is on plavix- pt took a hydrocodone prior to EMS arrival

## 2021-09-01 NOTE — ED Notes (Signed)
[place] C collar placed at this time.

## 2021-09-01 NOTE — Discharge Instructions (Signed)
You can take Norco for pain. Please take Zofran with Norco to avoid nausea. Please take stool softener with narcotic in order to avoid constipation. Please make follow-up appointment with Dr. Martha Clan, orthopedics.

## 2021-09-01 NOTE — ED Provider Notes (Signed)
[facility]-EMERGENCY DEPARTMENT  ____________________________________________  Time seen: Approximately 4:20 PM  I have reviewed the triage vital signs and the nursing notes.   HISTORY  Chief Complaint Fall   Historian Patient     HPI Rhonda Park is a 80 y.o. female presents to the emergency department by EMS after patient fell while trying to clean out her closet.  Patient was on a stool and she states that her dog knocked over the stool causing fall.  She states that she had to have back of her head but did not lose consciousness.  She is primarily complaining of neck pain but states that she does have discomfort in her right wrist.  She is on Plavix but denies other blood thinner usage.  No chest pain, chest tightness or abdominal pain.   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 Remote RCA stent;  b. Most recent cardiac catheterization in May of 2010 showed an EF of 65% with 50% ostial left main stenosis and 90% distal LAD stenosis close to the apex;  c. 11/2015 Low risk MV w/ subtle antlat ischemia; d. 08/2017 MV: No ischemia/infarct. Low Risk. EF 55-65%.   Diastolic dysfunction    a. 11/2015 Echo: EF 60-65%, Gr1 DD, mild AI, midly dil LA.   DOE (dyspnea on exertion)    a. With wheezing - does not like to use inhalers.   Essential hypertension    a. With evidence of hyperaldosteronism with severe hypokalemia. 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   GERD (gastroesophageal reflux disease)    Headache(784.0)    History of blood transfusion    30 units child birth   Hyperlipidemia    Hypothyroidism    Type II or unspecified type diabetes mellitus without mention of complication, not stated as uncontrolled    Not on medications   UC (ulcerative colitis) (HCC)     Unspecified congenital cystic kidney disease      Immunizations up to date:  Yes.     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 Remote RCA stent;  b. Most recent cardiac catheterization in May of 2010 showed an EF of 65% with 50% ostial left main stenosis and 90% distal LAD stenosis close to the apex;  c. 11/2015 Low risk MV w/ subtle antlat ischemia; d. 08/2017 MV: No ischemia/infarct. Low Risk. EF 55-65%.   Diastolic dysfunction    a. 11/2015 Echo: EF 60-65%, Gr1 DD, mild AI, midly dil LA.   DOE (dyspnea on exertion)    a. With wheezing - does not like to use inhalers.   Essential hypertension    a. With evidence of hyperaldosteronism with severe hypokalemia. 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   Displacement of lumbar intervertebral disc without myelopathy 10/25/2015   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   Diabetes (HCC) 07/03/2014   Difficulty in walking 07/03/2014   Falls 07/03/2014   History of stroke 07/03/2014   Memory loss, short term 07/03/2014   Visual disturbance as complication of stroke 07/03/2014   Spinal stenosis in cervical region 05/23/2014   Stenosis of cervical spine with myelopathy (HCC)  05/23/2014   Abdominal aortic aneurysm 10/27/2013   Coronary artery disease    Essential hypertension    Hyperlipidemia     Past Surgical History:  Procedure Laterality Date   ABDOMINAL HYSTERECTOMY     ANTERIOR CERVICAL DECOMP/DISCECTOMY FUSION N/A 05/23/2014   Procedure: CERVICAL FIVE SIX ANTERIOR CERVICAL DECOMPRESSION/DISCECTOMY FUSION 1 LEVEL;  Surgeon: Temple Pacini, MD;  Location: [facility];  Service: Neurosurgery;  Laterality: N/A;   APPENDECTOMY     BACK SURGERY     BREAST CYST EXCISION Left    3   CARDIAC CATHETERIZATION  03/2009   [facility]   CARDIAC CATHETERIZATION  05/2007   [facility]   CARDIAC CATHETERIZATION  05/2009   ''   CARDIAC CATHETERIZATION  07/2005   ''   CHOLECYSTECTOMY     hysterectomy     KNEE SURGERY Right    "growth"   LUMBAR LAMINECTOMY/DECOMPRESSION MICRODISCECTOMY Right 11/30/2015   Procedure: LUMBAR LAMINECTOMY/DECOMPRESSION MICRODISCECTOMY 1 LEVEL;  Surgeon: Julio Sicks, MD;  Location: [facility];  Service: Neurosurgery;  Laterality: Right;   posterior segment     unlisted procedure   TONSILLECTOMY      Prior to Admission medications   Medication Sig Start Date End Date Taking? Authorizing Provider  HYDROcodone-acetaminophen (NORCO) 5-325 MG tablet Take 1 tablet by mouth every 6 (six) hours as needed for up to 3 days for moderate pain. 09/01/21 09/04/21 Yes Pia Mau M, PA-C  ondansetron (ZOFRAN ODT) 4 MG disintegrating tablet Take 1 tablet (4 mg total) by mouth every 8 (eight) hours as needed for up to 5 days. 09/01/21 09/06/21 Yes Pia Mau M, PA-C  albuterol (VENTOLIN HFA) 108 (90 Base) MCG/ACT inhaler Inhale 1-2 puffs into the lungs every 6 (six) hours as needed for wheezing or shortness of breath. 11/18/19   Iran Ouch, MD  amLODipine (NORVASC) 10 MG tablet Take 1 tablet (10 mg total) by mouth daily. 03/06/20   Iran Ouch, MD  amLODipine (NORVASC) 10 MG tablet Take by mouth. 08/01/20   [provider]  b complex vitamins  tablet Take 1 tablet by mouth daily.    [provider]  Cholecalciferol (VITAMIN D-3) 5000 UNITS TABS Take 5,000 Units by mouth daily.     [provider]  clonazePAM (KLONOPIN) 1 MG tablet Take 1 mg by mouth 2 (two) times daily.    [provider]  clonazePAM (KLONOPIN) 1 MG tablet Take 1 tablet by mouth 2 (two) times daily as needed. 07/31/20   [provider]  clopidogrel (PLAVIX) 75 MG tablet Take 1 tablet (75 mg total) by mouth daily with breakfast. 03/06/20   Iran Ouch, MD  fluticasone furoate-vilanterol (BREO ELLIPTA) 100-25 MCG/INH AEPB Inhale  1 puff into the lungs daily.    [provider]  isosorbide mononitrate (IMDUR) 60 MG 24 hr tablet Take 1.5 tablets (90 mg total) by mouth daily. 10/24/14   Sondra Barges, PA-C  JANUVIA 100 MG tablet Take 100 mg by mouth daily.  03/23/18   [provider]  levothyroxine (SYNTHROID) 100 MCG tablet Take by mouth. 06/13/20   [provider]  levothyroxine (SYNTHROID, LEVOTHROID) 100 MCG tablet Take 100 mcg by mouth daily before breakfast.    [provider]  metoprolol succinate (TOPROL-XL) 50 MG 24 hr tablet Take 50 mg by mouth daily. Take with or immediately following a meal.    [provider]  sertraline (ZOLOFT) 100 MG tablet Take 100 mg by mouth 2 (two) times daily.     [provider]  sertraline (ZOLOFT) 100 MG tablet Take by mouth.    [provider]  spironolactone (ALDACTONE) 25 MG tablet Take 25 mg by mouth 2 (two) times daily.     [provider]    Allergies Breo ellipta [fluticasone furoate-vilanterol], Demerol [meperidine], Codeine, Ivp dye [iodinated diagnostic agents], Shellfish allergy, and Statins  Family History  Problem Relation Age of Onset   Heart attack Mother    Diabetes Mother    Hypertension Mother    Stroke Mother    Hypertension Father    Diabetes Maternal Grandmother    Diabetes Maternal Grandfather     Diabetes Sister    Diabetes Brother    Hypertension Sister    Hypertension Brother    Hypertension Son    Stroke Son     Social History Social History   Tobacco Use   Smoking status: Every Day    Packs/day: 0.02    Types: Cigarettes   Smokeless tobacco: Never  Substance Use Topics   Alcohol use: No   Drug use: No     Review of Systems  Constitutional: No fever/chills Eyes:  No discharge ENT: No upper respiratory complaints. Respiratory: no cough. No SOB/ use of accessory muscles to breath Gastrointestinal:   No nausea, no vomiting.  No diarrhea.  No constipation. Musculoskeletal: Patient has neck, back and right wrist pain.  Skin: Negative for rash, abrasions, lacerations, ecchymosis.    ____________________________________________   PHYSICAL EXAM:  VITAL SIGNS: ED Triage Vitals  Enc Vitals Group     BP 09/01/21 1604 (!) 148/54     Pulse Rate 09/01/21 1604 61     Resp 09/01/21 1604 18     Temp 09/01/21 1604 98.1 F (36.7 C)     Temp Source 09/01/21 1604 Oral     SpO2 09/01/21 1604 95 %     Weight 09/01/21 1606 170 lb (77.1 kg)     Height 09/01/21 1606 5\' 6"  (1.676 m)     Head Circumference --      Peak Flow --      Pain Score 09/01/21 1605 8     Pain Loc --      Pain Edu? --      Excl. in GC? --      Constitutional: Alert and oriented. Well appearing and in no acute distress. Eyes: Conjunctivae are normal. PERRL. EOMI. Head: Atraumatic. ENT:      Nose: No congestion/rhinnorhea.      Mouth/Throat: Mucous membranes are moist.  Neck: No stridor.  No cervical spine tenderness to palpation. Cardiovascular: Normal rate, regular rhythm. Normal S1 and S2.  Good peripheral circulation. Respiratory: Normal respiratory effort without tachypnea  or retractions. Lungs CTAB. Good air entry to the bases with no decreased or absent breath sounds Gastrointestinal: Bowel sounds x 4 quadrants. Soft and nontender to palpation. No guarding or rigidity. No  distention. Musculoskeletal: Patient has symmetric strength in the upper extremities.  Patient is unable to perform full range of motion at the right wrist due to pain.  Palpable radial and ulnar pulses bilaterally and symmetrically.. Neurologic:  Normal for age. No gross focal neurologic deficits are appreciated.  Skin:  Skin is warm, dry and intact. No rash noted. Psychiatric: Mood and affect are normal for age. Speech and behavior are normal.   ____________________________________________   LABS (all labs ordered are listed, but only abnormal results are displayed)  Labs Reviewed - No data to display ____________________________________________  EKG   ____________________________________________  RADIOLOGY Geraldo Pitter, personally viewed and evaluated these images (plain radiographs) as part of my medical decision making, as well as reviewing the written report by the radiologist.    DG Wrist 2 Views Right  Result Date: 09/01/2021 CLINICAL DATA:  Fracture, post reduction. EXAM: RIGHT WRIST - 2 VIEW COMPARISON:  None. FINDINGS: Single frontal view of the right wrist was performed per request of the referring clinician. Displaced distal radius fracture is in improved alignment. Nondisplaced ulna styloid fracture. Soft tissue edema is seen at the fracture sites. IMPRESSION: Improved alignment of distal radius fracture postreduction. Nondisplaced ulna styloid fracture. Electronically Signed   By: Narda Rutherford M.D.   On: 09/01/2021 19:22   DG Wrist Complete Right  Result Date: 09/01/2021 CLINICAL DATA:  Right wrist pain status post fall. EXAM: RIGHT WRIST - COMPLETE 3+ VIEW COMPARISON:  Wrist radiograph 01/08/2013 FINDINGS: Intra-articular moderately displaced transverse fracture of the distal radius extends to the distal radioulnar joint. The distal fracture fragment is displaced in the radial direction approximately 1 cm with approximately 0.6 cm of impaction. There is mild  volar apex angulation. Nondisplaced fracture of the base of the ulnar styloid. Soft tissue swelling about the wrist. Slight widening of the scapholunate interval, measuring 0.3 cm, unchanged compared to prior radiograph 01/08/2013. Radiocarpal joint is intact. 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 IMPRESSION: 1. Intra-articular moderately displaced transverse fracture of the distal radius, extending to the distal radioulnar joint. 2. Nondisplaced fracture of the base of the ulnar styloid. Electronically Signed   By: Sherron Ales M.D.   On: 09/01/2021 17:15   CT Head Wo Contrast  Result Date: 09/01/2021 CLINICAL DATA:  Fall while standing on a stool. EXAM: CT HEAD WITHOUT CONTRAST CT CERVICAL SPINE WITHOUT CONTRAST TECHNIQUE: Multidetector CT imaging of the head and cervical spine was performed following the standard protocol without intravenous contrast. Multiplanar CT image reconstructions of the cervical spine were also generated. COMPARISON:  CT head and cervical spine dated 01/10/2016. FINDINGS: CT HEAD FINDINGS Brain: No evidence of acute infarction, hemorrhage, hydrocephalus, extra-axial collection or mass lesion/mass effect. A chronic infarct of the left occipital lobe is redemonstrated. Periventricular white matter hypoattenuation likely represents chronic small vessel ischemic disease. Vascular: There are vascular calcifications in the carotid siphons. Skull: Normal. Negative for fracture or focal lesion. Sinuses/Orbits: Bilateral ethmoid sinus disease is noted. Other: None. CT CERVICAL SPINE FINDINGS Alignment: Normal. Skull base and vertebrae: The patient is status post anterior fixation with plate and screws and interbody disc spacer at C5-C6. No acute fracture. No primary bone lesion or focal pathologic process. Soft tissues and spinal canal: No prevertebral fluid or swelling. No visible  canal hematoma. Disc levels:  Mild multilevel degenerative disc and joint disease. Upper  chest: Negative. Other: None. IMPRESSION: 1. No acute intracranial process. 2. No acute osseous injury in the cervical spine. Electronically Signed   By: Romona Curls M.D.   On: 09/01/2021 16:59   CT Cervical Spine Wo Contrast  Result Date: 09/01/2021 CLINICAL DATA:  Fall while standing on a stool. EXAM: CT HEAD WITHOUT CONTRAST CT CERVICAL SPINE WITHOUT CONTRAST TECHNIQUE: Multidetector CT imaging of the head and cervical spine was performed following the standard protocol without intravenous contrast. Multiplanar CT image reconstructions of the cervical spine were also generated. COMPARISON:  CT head and cervical spine dated 01/10/2016. FINDINGS: CT HEAD FINDINGS Brain: No evidence of acute infarction, hemorrhage, hydrocephalus, extra-axial collection or mass lesion/mass effect. A chronic infarct of the left occipital lobe is redemonstrated. Periventricular white matter hypoattenuation likely represents chronic small vessel ischemic disease. Vascular: There are vascular calcifications in the carotid siphons. Skull: Normal. Negative for fracture or focal lesion. Sinuses/Orbits: Bilateral ethmoid sinus disease is noted. Other: None. CT CERVICAL SPINE FINDINGS Alignment: Normal. Skull base and vertebrae: The patient is status post anterior fixation with plate and screws and interbody disc spacer at C5-C6. No acute fracture. No primary bone lesion or focal pathologic process. Soft tissues and spinal canal: No prevertebral fluid or swelling. No visible canal hematoma. Disc levels:  Mild multilevel degenerative disc and joint disease. Upper chest: Negative. Other: None. IMPRESSION: 1. No acute intracranial process. 2. No acute osseous injury in the cervical spine. Electronically Signed   By: Romona Curls M.D.   On: 09/01/2021 16:59   CT Thoracic Spine Wo Contrast  Result Date: 09/01/2021 CLINICAL DATA:  Fall while standing on a stool with back pain. EXAM: CT THORACIC SPINE WITHOUT CONTRAST TECHNIQUE:  Multidetector CT images of the thoracic were obtained using the standard protocol without intravenous contrast. COMPARISON:  Chest radiograph dated 11/27/2017 and MR thoracic spine dated 06/10/2010. FINDINGS: Alignment: There is gentle dextrocurvature of the thoracic spine. No significant listhesis. Vertebrae: No acute fracture or focal pathologic process. Paraspinal and other soft tissues: Vascular calcifications are seen in the thoracic aorta. Disc levels: Moderate multilevel degenerative disc disease. IMPRESSION: No acute osseous injury. Electronically Signed   By: Romona Curls M.D.   On: 09/01/2021 17:07   CT Lumbar Spine Wo Contrast  Result Date: 09/01/2021 CLINICAL DATA:  Fall while standing on a stool with back pain. EXAM: CT LUMBAR SPINE WITHOUT CONTRAST TECHNIQUE: Multidetector CT imaging of the lumbar spine was performed without intravenous contrast administration. Multiplanar CT image reconstructions were also generated. COMPARISON:  Lumbar spine radiographs dated 01/10/2016 and MR lumbar spine dated 06/10/2010. FINDINGS: Segmentation: 5 lumbar type vertebrae. Alignment: There is dextrocurvature centered at L3 with 6 mm right lateral listhesis of L3 on L4. These findings are likely degenerative and appear similar to 10/11/2020. Sagittal alignment is normal. Vertebrae: No acute fracture or focal pathologic process. Paraspinal and other soft tissues: Vascular calcifications are seen in the abdominal aorta. A hyperdense cyst is seen on the left kidney measuring 21 mm, similar to MR dated 06/10/2010. Disc levels: There is severe multilevel degenerative disc and joint disease with mild neuroforaminal stenosis at multiple levels. IMPRESSION: No acute osseous injury. Electronically Signed   By: Romona Curls M.D.   On: 09/01/2021 17:04    ____________________________________________    PROCEDURES  Procedure(s) performed:     Reduction of fracture  Date/Time: 09/01/2021 9:24 PM Performed by:  Orvil Feil, PA-C  Authorized by: Orvil Feil, PA-C  Consent: Verbal consent obtained. Consent given by: patient Patient understanding: patient states understanding of the procedure being performed Patient consent: the patient's understanding of the procedure matches consent given Procedure consent: procedure consent matches procedure scheduled Relevant documents: relevant documents present and verified Patient identity confirmed: verbally with patient Time out: Immediately prior to procedure a "time out" was called to verify the correct patient, procedure, equipment, support staff and site/side marked as required. Local anesthesia used: no  Anesthesia: Local anesthesia used: no  Sedation: Patient sedated: yes Sedation type: moderate (conscious) sedation Sedatives: fentanyl and midazolam Vitals: Vital signs were monitored during sedation.  Comments: Patient's fracture was reduced using extension and traction at the wrist.       Medications  fentaNYL citrate (PF) (SUBLIMAZE) injection 200 mcg (has no administration in time range)  midazolam (VERSED) injection 6 mg (has no administration in time range)  midazolam (VERSED) 5 MG/5ML injection (3 mg Intravenous Given 09/01/21 1852)  fentaNYL (SUBLIMAZE) injection (100 mcg Intravenous Given 09/01/21 1853)     ____________________________________________   INITIAL IMPRESSION / ASSESSMENT AND PLAN / ED COURSE  Pertinent labs & imaging results that were available during my care of the patient were reviewed by me and considered in my medical decision making (see chart for details).  Clinical Course as of 09/01/21 2122  Wynelle Link Sep 01, 2021  1905 CT Thoracic Spine Wo Contrast [JW]    Clinical Course User Index [JW] Orvil Feil, PA-C     Assessment and plan:  Fall 80 year old female presents to the emergency department after a mechanical fall complaining of right wrist pain and back pain.  CTs of the thoracic, lumbar  and cervical spine showed no bony abnormality.  No evidence of intracranial bleed or skull fracture on dedicated CTs of the head.  X-ray of the right wrist indicated a displaced distal radius fracture.    Patient underwent conscious sedation and reduction occurred in the emergency department without complication with significantly improved alignment.  A splint was placed and patient was advised to follow-up with orthopedics, Dr. Martha Clan.  She was prescribed a short course of Norco for pain.  Patient's son was contacted several times during this emergency department encounter with care updates.  Patient was seen in conjunction with attending Dr. Clayburn Pert Radler who provide sedation note.   ____________________________________________  FINAL CLINICAL IMPRESSION(S) / ED DIAGNOSES  Final diagnoses:  Closed fracture of distal end of right radius, unspecified fracture morphology, initial encounter      NEW MEDICATIONS STARTED DURING THIS VISIT:  ED Discharge Orders          Ordered    HYDROcodone-acetaminophen (NORCO) 5-325 MG tablet  Every 6 hours PRN        09/01/21 2014    ondansetron (ZOFRAN ODT) 4 MG disintegrating tablet  Every 8 hours PRN        09/01/21 2014                This chart was dictated using voice recognition software/Dragon. Despite best efforts to proofread, errors can occur which can change the meaning. Any change was purely unintentional.     Orvil Feil, PA-C 09/01/21 2125    Merwyn Katos, MD 09/03/21 [phone]

## 2021-09-02 ENCOUNTER — Ambulatory Visit (INDEPENDENT_AMBULATORY_CARE_PROVIDER_SITE_OTHER): Payer: Medicare HMO | Admitting: Vascular Surgery

## 2021-09-05 ENCOUNTER — Encounter (INDEPENDENT_AMBULATORY_CARE_PROVIDER_SITE_OTHER): Payer: Self-pay | Admitting: Vascular Surgery

## 2021-09-05 ENCOUNTER — Other Ambulatory Visit: Payer: Self-pay

## 2021-09-05 ENCOUNTER — Ambulatory Visit (INDEPENDENT_AMBULATORY_CARE_PROVIDER_SITE_OTHER): Payer: Medicare HMO | Admitting: Vascular Surgery

## 2021-09-05 VITALS — BP 112/68 | HR 68 | Resp 16 | Wt 167.6 lb

## 2021-09-05 DIAGNOSIS — I10 Essential (primary) hypertension: Secondary | ICD-10-CM | POA: Diagnosis not present

## 2021-09-05 DIAGNOSIS — I25118 Atherosclerotic heart disease of native coronary artery with other forms of angina pectoris: Secondary | ICD-10-CM | POA: Diagnosis not present

## 2021-09-05 DIAGNOSIS — I7143 Infrarenal abdominal aortic aneurysm, without rupture: Secondary | ICD-10-CM | POA: Diagnosis not present

## 2021-09-05 DIAGNOSIS — I6529 Occlusion and stenosis of unspecified carotid artery: Secondary | ICD-10-CM | POA: Insufficient documentation

## 2021-09-05 DIAGNOSIS — I6523 Occlusion and stenosis of bilateral carotid arteries: Secondary | ICD-10-CM

## 2021-09-05 DIAGNOSIS — E785 Hyperlipidemia, unspecified: Secondary | ICD-10-CM

## 2021-09-05 NOTE — Progress Notes (Signed)
MRN : [id number]  Rhonda Park is a 80 y.o. (Dec 26, 1940) female who presents with chief complaint of follow up CT.  History of Present Illness:   The patient is seen for follow up evaluation of carotid stenosis status post CT angiogram. CT scan was done 08/22/2021. Patient reports that the test went well with no problems or complications.   The patient denies interval amaurosis fugax. There is no recent or interval TIA symptoms or focal motor deficits. There is no prior documented CVA.  The patient is taking Plavix daily as she does not tolerate aspirin.  The patient reports no recent episodes of angina or shortness of breath. The patient denies PAD or claudication symptoms. There is a history of hyperlipidemia which is being treated with a statin.    CT angiogram is reviewed by me personally and shows 50% RICA and minimal LICA stenosis.    Current Meds  Medication Sig   albuterol (VENTOLIN HFA) 108 (90 Base) MCG/ACT inhaler Inhale 1-2 puffs into the lungs every 6 (six) hours as needed for wheezing or shortness of breath.   amLODipine (NORVASC) 10 MG tablet Take 1 tablet (10 mg total) by mouth daily.   amLODipine (NORVASC) 10 MG tablet Take by mouth.   b complex vitamins tablet Take 1 tablet by mouth daily.   Cholecalciferol (VITAMIN D-3) 5000 UNITS TABS Take 5,000 Units by mouth daily.    clonazePAM (KLONOPIN) 1 MG tablet Take 1 mg by mouth 2 (two) times daily.   clonazePAM (KLONOPIN) 1 MG tablet Take 1 tablet by mouth 2 (two) times daily as needed.   clopidogrel (PLAVIX) 75 MG tablet Take 1 tablet (75 mg total) by mouth daily with breakfast.   fluticasone furoate-vilanterol (BREO ELLIPTA) 100-25 MCG/INH AEPB Inhale 1 puff into the lungs daily.   HYDROcodone-acetaminophen (NORCO/VICODIN) 5-325 MG tablet Take 1 tablet by mouth every 6 (six) hours as needed for moderate pain.   isosorbide mononitrate (IMDUR) 60 MG 24 hr tablet Take 1.5 tablets (90 mg total) by mouth daily.    JANUVIA 100 MG tablet Take 100 mg by mouth daily.    levothyroxine (SYNTHROID) 100 MCG tablet Take by mouth.   levothyroxine (SYNTHROID, LEVOTHROID) 100 MCG tablet Take 100 mcg by mouth daily before breakfast.   metoprolol succinate (TOPROL-XL) 50 MG 24 hr tablet Take 50 mg by mouth daily. Take with or immediately following a meal.   ondansetron (ZOFRAN ODT) 4 MG disintegrating tablet Take 1 tablet (4 mg total) by mouth every 8 (eight) hours as needed for up to 5 days.   ondansetron (ZOFRAN) 4 MG tablet Take 4 mg by mouth every 8 (eight) hours as needed for nausea or vomiting.   sertraline (ZOLOFT) 100 MG tablet Take 100 mg by mouth 2 (two) times daily.    sertraline (ZOLOFT) 100 MG tablet Take by mouth.   spironolactone (ALDACTONE) 25 MG tablet Take 25 mg by mouth 2 (two) times daily.     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 Remote RCA stent;  b. Most recent cardiac catheterization in May of 2010 showed an EF of 65% with 50% ostial left main stenosis and 90% distal LAD stenosis close to the apex;  c. 11/2015  Low risk MV w/ subtle antlat ischemia; d. 08/2017 MV: No ischemia/infarct. Low Risk. EF 55-65%.   Diastolic dysfunction    a. 11/2015 Echo: EF 60-65%, Gr1 DD, mild AI, midly dil LA.   DOE (dyspnea on exertion)    a. With wheezing - does not like to use inhalers.   Essential hypertension    a. With evidence of hyperaldosteronism with severe hypokalemia. 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   GERD (gastroesophageal reflux disease)    Headache(784.0)    History of blood transfusion    30 units child birth   Hyperlipidemia    Hypothyroidism    Type II or unspecified type diabetes mellitus without mention of complication, not stated as uncontrolled    Not on medications    UC (ulcerative colitis) (HCC)    Unspecified congenital cystic kidney disease     Past Surgical History:  Procedure Laterality Date   ABDOMINAL HYSTERECTOMY     ANTERIOR CERVICAL DECOMP/DISCECTOMY FUSION N/A 05/23/2014   Procedure: CERVICAL FIVE SIX ANTERIOR CERVICAL DECOMPRESSION/DISCECTOMY FUSION 1 LEVEL;  Surgeon: Temple Pacini, MD;  Location: [facility];  Service: Neurosurgery;  Laterality: N/A;   APPENDECTOMY     BACK SURGERY     BREAST CYST EXCISION Left    3   CARDIAC CATHETERIZATION  03/2009   [facility]   CARDIAC CATHETERIZATION  05/2007   [facility]   CARDIAC CATHETERIZATION  05/2009   ''   CARDIAC CATHETERIZATION  07/2005   ''   CHOLECYSTECTOMY     hysterectomy     KNEE SURGERY Right    "growth"   LUMBAR LAMINECTOMY/DECOMPRESSION MICRODISCECTOMY Right 11/30/2015   Procedure: LUMBAR LAMINECTOMY/DECOMPRESSION MICRODISCECTOMY 1 LEVEL;  Surgeon: Julio Sicks, MD;  Location: [facility];  Service: Neurosurgery;  Laterality: Right;   posterior segment     unlisted procedure   TONSILLECTOMY      Social History Social History   Tobacco Use   Smoking status: Every Day    Packs/day: 0.02    Types: Cigarettes   Smokeless tobacco: Never  Substance Use Topics   Alcohol use: No   Drug use: No    Family History Family History  Problem Relation Age of Onset   Heart attack Mother    Diabetes Mother    Hypertension Mother    Stroke Mother    Hypertension Father    Diabetes Maternal Grandmother    Diabetes Maternal Grandfather    Diabetes Sister    Diabetes Brother    Hypertension Sister    Hypertension Brother    Hypertension Son    Stroke Son     Allergies  Allergen Reactions   Breo Ellipta [Fluticasone Furoate-Vilanterol] Palpitations   Demerol [Meperidine] Other (See Comments)    Other Reaction: CNS Disorder   Codeine     Reports that she gets jittery and does not feel right able to tolerate hydrocodone   Ivp Dye [Iodinated Diagnostic Agents] Hives   Shellfish Allergy  Hives   Statins Other (See Comments)    myalgia myalgia     REVIEW OF SYSTEMS (Negative unless checked)  Constitutional: [] Weight loss  [] Fever  [] Chills Cardiac: [] Chest pain   [] Chest pressure   [] Palpitations   [] Shortness of breath when laying flat   [] Shortness of breath with exertion. Vascular:  [] Pain in legs with walking   [] Pain in legs at rest  [] History of DVT   [] Phlebitis   [] Swelling in legs   [] Varicose veins   [] Non-healing ulcers Pulmonary:   []   Uses home oxygen   [] Productive cough   [] Hemoptysis   [] Wheeze  [] COPD   [] Asthma Neurologic:  [] Dizziness   [] Seizures   [] History of stroke   [] History of TIA  [] Aphasia   [] Vissual changes   [] Weakness or numbness in arm   [] Weakness or numbness in leg Musculoskeletal:   [] Joint swelling   [] Joint pain   [] Low back pain Hematologic:  [] Easy bruising  [] Easy bleeding   [] Hypercoagulable state   [] Anemic Gastrointestinal:  [] Diarrhea   [] Vomiting  [] Gastroesophageal reflux/heartburn   [] Difficulty swallowing. Genitourinary:  [] Chronic kidney disease   [] Difficult urination  [] Frequent urination   [] Blood in urine Skin:  [] Rashes   [] Ulcers  Psychological:  [] History of anxiety   []  History of major depression.  Physical Examination  Vitals:   09/05/21 1157  BP: 112/68  Pulse: 68  Resp: 16  Weight: 167 lb 9.6 oz (76 kg)   Body mass index is 27.05 kg/m. Gen: WD/WN, NAD Head: [place]/AT, No temporalis wasting.  Ear/Nose/Throat: Hearing grossly intact, nares w/o erythema or drainage Eyes: PER, EOMI, sclera nonicteric.  Neck: Supple, no masses.  No bruit or JVD.  Pulmonary:  Good air movement, no audible wheezing, no use of accessory muscles.  Cardiac: RRR, normal S1, S2, no Murmurs. Vascular: Right carotid bruit Vessel Right Left  Radial Palpable Palpable  Carotid Palpable Palpable  PT Palpable Palpable  DP Palpable Palpable  Gastrointestinal: soft, non-distended. No guarding/no peritoneal signs.  Musculoskeletal: M/S  5/5 throughout.  No visible deformity.  Neurologic: CN 2-12 intact. Pain and light touch intact in extremities.  Symmetrical.  Speech is fluent. Motor exam as listed above. Psychiatric: Judgment intact, Mood & affect appropriate for pt's clinical situation. Dermatologic: No rashes or ulcers noted.  No changes consistent with cellulitis.   CBC Lab Results  Component Value Date   WBC 6.8 09/05/2020   HGB 12.5 09/05/2020   HCT 37.7 09/05/2020   MCV 95.4 09/05/2020   PLT 195 09/05/2020    BMET    Component Value Date/Time   NA 137 03/04/2018 2052   NA 134 (L) 11/02/2013 1011   NA 135 07/25/2010 0844   K 4.6 03/04/2018 2052   K 4.4 11/02/2013 1011   K 4.7 07/25/2010 0844   CL 102 03/04/2018 2052   CL 100 11/02/2013 1011   CL 97 (L) 07/25/2010 0844   CO2 27 03/04/2018 2052   CO2 31 11/02/2013 1011   CO2 27 07/25/2010 0844   GLUCOSE 108 (H) 03/04/2018 2052   GLUCOSE 120 (H) 11/02/2013 1011   GLUCOSE 137 (H) 07/25/2010 0844   BUN 13 03/04/2018 2052   BUN 22 (H) 11/02/2013 1011   BUN 27 (H) 07/25/2010 0844   CREATININE 1.06 (H) 03/04/2018 2052   CREATININE 1.22 11/02/2013 1011   CREATININE 1.1 07/25/2010 0844   CALCIUM 9.6 03/04/2018 2052   CALCIUM 8.7 11/02/2013 1011   CALCIUM 9.0 07/25/2010 0844   GFRNONAA 50 (L) 03/04/2018 2052   GFRNONAA 45 (L) 11/02/2013 1011   GFRAA 58 (L) 03/04/2018 2052   GFRAA 52 (L) 11/02/2013 1011   CrCl cannot be calculated (Patient's most recent lab result is older than the maximum 21 days allowed.).  COAG No results found for: INR, PROTIME  Radiology DG Wrist 2 Views Right  Result Date: 09/01/2021 CLINICAL DATA:  Fracture, post reduction. EXAM: RIGHT WRIST - 2 VIEW COMPARISON:  None. FINDINGS: Single frontal view of the right wrist was performed per request of the referring clinician.  Displaced distal radius fracture is in improved alignment. Nondisplaced ulna styloid fracture. Soft tissue edema is seen at the fracture sites. IMPRESSION:  Improved alignment of distal radius fracture postreduction. Nondisplaced ulna styloid fracture. Electronically Signed   By: Melanie  Sanford M.D.   On: 09/01/2021 19:22   DG Wrist Complete Right  Result Date: 09/01/2021 CLINICAL DATA:  Right wrist pain status post fall. EXAM: RIGHT WRIST - COMPLETE 3+ VIEW COMPARISON:  Wrist radiograph 01/08/2013 FINDINGS: Intra-articular moderately displaced transverse fracture of the distal radius extends to the distal radioulnar joint. The distal fracture fragment is displaced in the radial direction approximately 1 cm with approximately 0.6 cm of impaction. There is mild volar apex angulation. Nondisplaced fracture of the base of the ulnar styloid. Soft tissue swelling about the wrist. Slight widening of the scapholunate interval, measuring 0.3 cm, unchanged compared to prior radiograph 01/08/2013. Radiocarpal joint is intact. 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 IMPRESSION: 1. Intra-articular moderately displaced transverse fracture of the distal radius, extending to the distal radioulnar joint. 2. Nondisplaced fracture of the base of the ulnar styloid. Electronically Signed   By: Laura  Parra M.D.   On: 09/01/2021 17:15   CT Head Wo Contrast  Result Date: 09/01/2021 CLINICAL DATA:  Fall while standing on a stool. EXAM: CT HEAD WITHOUT CONTRAST CT CERVICAL SPINE WITHOUT CONTRAST TECHNIQUE: Multidetector CT imaging of the head and cervical spine was performed following the standard protocol without intravenous contrast. Multiplanar CT image reconstructions of the cervical spine were also generated. COMPARISON:  CT head and cervical spine dated 01/10/2016. FINDINGS: CT HEAD FINDINGS Brain: No evidence of acute infarction, hemorrhage, hydrocephalus, extra-axial collection or mass lesion/mass effect. A chronic infarct of the left occipital lobe is redemonstrated. Periventricular white matter hypoattenuation likely represents chronic small vessel  ischemic disease. Vascular: There are vascular calcifications in the carotid siphons. Skull: Normal. Negative for fracture or focal lesion. Sinuses/Orbits: Bilateral ethmoid sinus disease is noted. Other: None. CT CERVICAL SPINE FINDINGS Alignment: Normal. Skull base and vertebrae: The patient is status post anterior fixation with plate and screws and interbody disc spacer at C5-C6. No acute fracture. No primary bone [place] pathologic process. Soft tissues and spinal canal: No prevertebral flui >aged portion shows no evidence of aneurysm or dissection. No significant stenosis of the major arch vessel origins. Right carotid system: Approximately 55% narrowing at the origin of the right internal carotid artery, secondary to calcified plaque. No evidence of dissection or occlusion. Mild luminal narrowing of the proximal right common carotid artery. Left carotid system: No evidence of dissection, hemodynamically significant stenosis (50% or greater) or occlusion. Less than 50% luminal  narrowing of  the proximal and mid left common carotid artery secondary to noncalcified plaque. Calcifications at the carotid bifurcation and in the proximal left ICA, with less than 50% luminal narrowing. Vertebral arteries: Moderate to severe calcifications the origin of the right vertebral artery, which is otherwise patent. Left dominant vertebral system. Mild calcifications in the left V4 segment. The majority of the right vertebral artery supplies the right PICA. No evidence of dissection or occlusion. Skeleton: Status post C5-C6 ACDF.  No acute osseous abnormality. Other neck: Calcified subcentimeter nodule in the right thyroid lobe. Upper chest: Negative. IMPRESSION: 1. Approximate 50/5% stenosis of the origin of the right internal carotid artery, secondary to calcified plaque. 2. Moderate to severe narrowing at the origin of the right vertebral artery, which is otherwise patent. Left dominant vertebral artery system. Electronically Signed   By: Wiliam Ke M.D.   On: 08/24/2021 16:55   CT Cervical Spine Wo Contrast  Result Date: 09/01/2021 CLINICAL DATA:  Fall while standing on a stool. EXAM: CT HEAD WITHOUT CONTRAST CT CERVICAL SPINE WITHOUT CONTRAST TECHNIQUE: Multidetector CT imaging of the head and cervical spine was performed following the standard protocol without intravenous contrast. Multiplanar CT image reconstructions of the cervical spine were also generated. COMPARISON:  CT head and cervical spine dated 01/10/2016. FINDINGS: CT HEAD FINDINGS Brain: No evidence of acute infarction, hemorrhage, hydrocephalus, extra-axial collection or mass lesion/mass effect. A chronic infarct of the left occipital lobe is redemonstrated. Periventricular white matter hypoattenuation likely represents chronic small vessel ischemic disease. Vascular: There are vascular calcifications in the carotid siphons. Skull: Normal. Negative for fracture or focal lesion. Sinuses/Orbits: Bilateral ethmoid sinus disease is  noted. Other: None. CT CERVICAL SPINE FINDINGS Alignment: Normal. Skull base and vertebrae: The patient is status post anterior fixation with plate and screws and interbody disc spacer at C5-C6. No acute fracture. No primary bone lesion or focal pathologic process. Soft tissues and spinal canal: No prevertebral fluid or swelling. No visible canal hematoma. Disc levels:  Mild multilevel degenerative disc and joint disease. Upper chest: Negative. Other: None. IMPRESSION: 1. No acute intracranial process. 2. No acute osseous injury in the cervical spine. Electronically Signed   By: Romona Curls M.D.   On: 09/01/2021 16:59   CT Thoracic Spine Wo Contrast  Result Date: 09/01/2021 CLINICAL DATA:  Fall while standing on a stool with back pain. EXAM: CT THORACIC SPINE WITHOUT CONTRAST TECHNIQUE: Multidetector CT images of the thoracic were obtained using the standard protocol without intravenous contrast. COMPARISON:  Chest radiograph dated 11/27/2017 and MR thoracic spine dated 06/10/2010. FINDINGS: Alignment: There is gentle dextrocurvature of the thoracic spine. No significant listhesis. Vertebrae: No acute fracture or focal pathologic process. Paraspinal and other soft tissues: Vascular calcifications are seen in the thoracic aorta. Disc levels: Moderate multilevel degenerative disc disease. IMPRESSION: No acute osseous injury. Electronically Signed   By: Romona Curls M.D.   On: 09/01/2021 17:07   CT Lumbar Spine Wo Contrast  Result Date: 09/01/2021 CLINICAL DATA:  Fall while standing on a stool with back pain. EXAM: CT LUMBAR SPINE WITHOUT CONTRAST TECHNIQUE: Multidetector CT imaging of the lumbar spine was performed without intravenous contrast administration. Multiplanar CT image reconstructions were also generated. COMPARISON:  Lumbar spine radiographs dated 01/10/2016 and MR lumbar spine dated 06/10/2010. FINDINGS: Segmentation: 5 lumbar type vertebrae. Alignment: There is dextrocurvature centered at L3  with 6 mm right lateral listhesis of L3 on L4. These findings are likely degenerative and appear similar to 10/11/2020. Sagittal alignment is normal.  Vertebrae: No acute fracture or focal pathologic process. Paraspinal and other soft tissues: Vascular calcifications are seen in the abdominal aorta. A hyperdense cyst is seen on the left kidney measuring 21 mm, similar to MR dated 06/10/2010. Disc levels: There is severe multilevel degenerative disc and joint disease with mild neuroforaminal stenosis at multiple levels. IMPRESSION: No acute osseous injury. Electronically Signed   By: Romona Curls M.D.   On: 09/01/2021 17:04     Assessment/Plan: 1. Bilateral carotid artery stenosis Recommend:  Given the patient's asymptomatic subcritical stenosis no further invasive testing or surgery at this time.  Duplex ultrasound shows 50% RICA and <30% LICA stenosis.  Continue antiplatelet therapy as prescribed Continue management of CAD, HTN and Hyperlipidemia Healthy heart diet,  encouraged exercise at least 4 times per week.  Follow up in 12 months with duplex ultrasound and physical exam.    - VAS US CAROTID; Future  2. Infrarenal abdominal aortic aneurysm (AAA) without rupture The patient has a documented history of infrarenal abdominal aortic aneurysm.  I cannot find any recent studies.  A CT scan recently to evaluate her LS spine after a fall does not show an aneurysm but the aorta is not visualized down to the bifurcation.  I will obtain a duplex ultrasound with her next visit to resolve this issue and ensure that she does not have an abdominal aortic aneurysm. - VAS US AORTA/IVC/ILIACS; Future  3. Coronary artery disease of native artery of native heart with stable angina pectoris (HCC) Continue cardiac and antihypertensive medications as already ordered and reviewed, no changes at this time.  Continue statin as ordered and reviewed, no changes at this time  Nitrates PRN for chest pain   4.  Essential hypertension Continue antihypertensive medications as already ordered, these medications have been reviewed and there are no changes at this time.   5. Hyperlipidemia, unspecified hyperlipidemia type Continue statin as ordered and reviewed, no changes at this time    Levora Dredge, MD  09/05/2021 12:02 PM

## 2022-09-03 DIAGNOSIS — I70219 Atherosclerosis of native arteries of extremities with intermittent claudication, unspecified extremity: Secondary | ICD-10-CM | POA: Insufficient documentation

## 2022-09-03 DIAGNOSIS — I712 Thoracic aortic aneurysm, without rupture, unspecified: Secondary | ICD-10-CM | POA: Insufficient documentation

## 2022-09-03 NOTE — Progress Notes (Deleted)
MRN : [id number]  Rhonda Park is a 81 y.o. (Jun 27, 1941) female who presents with chief complaint of check circulation.  History of Present Illness:   The patient is seen for follow up evaluation of carotid stenosis status post CT angiogram. CT scan was done 08/22/2021. Patient reports that the test went well with no problems or complications.    The patient denies interval amaurosis fugax. There is no recent or interval TIA symptoms or focal motor deficits. There is no prior documented CVA.   The patient is taking Plavix daily as she does not tolerate aspirin.  The patient returns to the office for surveillance of a known abdominal aortic aneurysm. Patient denies abdominal pain or back pain, no other abdominal complaints. No changes suggesting embolic episodes.   There have been no interval changes in the patient's overall health care since his last visit.   The patient reports no recent episodes of angina or shortness of breath. The patient denies PAD or claudication symptoms. There is a history of hyperlipidemia which is being treated with a statin.     CT angiogram is reviewed by me personally and shows 50% RICA and minimal LICA stenosis.    Duplex US of the aorta and iliac arteries shows an AAA measured *** cm.   No outpatient medications have been marked as taking for the 09/08/22 encounter (Appointment) with Gilda Crease, Latina Craver, MD.    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 Remote RCA stent;  b. Most recent cardiac catheterization in May of 2010 showed an EF of 65% with 50% ostial left main stenosis and 90% distal LAD stenosis close to the apex;  c. 11/2015 Low risk MV w/ subtle antlat ischemia; d. 08/2017 MV: No ischemia/infarct. Low Risk. EF 55-65%.    Diastolic dysfunction    a. 11/2015 Echo: EF 60-65%, Gr1 DD, mild AI, midly dil LA.   DOE (dyspnea on exertion)    a. With wheezing - does not like to use inhalers.   Essential hypertension    a. With evidence of hyperaldosteronism with severe hypokalemia. 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   GERD (gastroesophageal reflux disease)    Headache(784.0)    History of blood transfusion    30 units child birth   Hyperlipidemia    Hypothyroidism    Type II or unspecified type diabetes mellitus without mention of complication, not stated as uncontrolled    Not on medications   UC (ulcerative colitis) (HCC)    Unspecified congenital cystic kidney disease     Past Surgical History:  Procedure Laterality Date   ABDOMINAL HYSTERECTOMY     ANTERIOR CERVICAL DECOMP/DISCECTOMY FUSION N/A 05/23/2014   Procedure: CERVICAL FIVE SIX ANTERIOR CERVICAL DECOMPRESSION/DISCECTOMY FUSION 1 LEVEL;  Surgeon: Temple Pacini, MD;  Location: [facility];  Service: Neurosurgery;  Laterality: N/A;   APPENDECTOMY     BACK SURGERY     BREAST CYST EXCISION Left    3   CARDIAC CATHETERIZATION  03/2009   [facility]   CARDIAC CATHETERIZATION  05/2007   [facility]   CARDIAC CATHETERIZATION  05/2009   ''   CARDIAC CATHETERIZATION  07/2005   ''   CHOLECYSTECTOMY     hysterectomy     KNEE SURGERY Right    "growth"   LUMBAR LAMINECTOMY/DECOMPRESSION MICRODISCECTOMY Right 11/30/2015   Procedure: LUMBAR LAMINECTOMY/DECOMPRESSION MICRODISCECTOMY 1 LEVEL;  Surgeon: Earnie Larsson, MD;  Location: [place] [facility];  Service: Neurosurgery;  Laterality: Right;   posterior segment     unlisted procedure   TONSILLECTOMY      Social History Social History   Tobacco Use   Smoking status: Every Day    Packs/day: 0.02    Types: Cigarettes   Smokeless tobacco: Never  Substance Use Topics   Alcohol use: No   Drug use: No    Family History Family History  Problem Relation Age of Onset   Heart attack Mother    Diabetes Mother     Hypertension Mother    Stroke Mother    Hypertension Father    Diabetes Maternal Grandmother    Diabetes Maternal Grandfather    Diabetes Sister    Diabetes Brother    Hypertension Sister    Hypertension Brother    Hypertension Son    Stroke Son     Allergies  Allergen Reactions   Breo Ellipta [Fluticasone Furoate-Vilanterol] Palpitations   Demerol [Meperidine] Other (See Comments)    Other Reaction: CNS Disorder   Codeine     Reports that she gets jittery and does not feel right able to tolerate hydrocodone   Ivp Dye [Iodinated Contrast Media] Hives   Shellfish Allergy Hives   Statins Other (See Comments)    myalgia myalgia     REVIEW OF SYSTEMS (Negative unless checked)  Constitutional: [] Weight loss  [] Fever  [] Chills Cardiac: [] Chest pain   [] Chest pressure   [] Palpitations   [] Shortness of breath when laying flat   [] Shortness of breath with exertion. Vascular:  [x] Pain in legs with walking   [] Pain in legs at rest  [] History of DVT   [] Phlebitis   [] Swelling in legs   [] Varicose veins   [] Non-healing ulcers Pulmonary:   [] Uses home oxygen   [] Productive cough   [] Hemoptysis   [] Wheeze  [] COPD   [] Asthma Neurologic:  [] Dizziness   [] Seizures   [] History of stroke   [] History of TIA  [] Aphasia   [] Vissual changes   [] Weakness or numbness in arm   [] Weakness or numbness in leg Musculoskeletal:   [] Joint swelling   [] Joint pain   [] Low back pain Hematologic:  [] Easy bruising  [] Easy bleeding   [] Hypercoagulable state   [] Anemic Gastrointestinal:  [] Diarrhea   [] Vomiting  [] Gastroesophageal reflux/heartburn   [] Difficulty swallowing. Genitourinary:  [] Chronic kidney disease   [] Difficult urination  [] Frequent urination   [] Blood in urine Skin:  [] Rashes   [] Ulcers  Psychological:  [] History of anxiety   []  History of major depression.  Physical Examination  There were no vitals filed for this visit. There is no height or weight on file to calculate BMI. Gen: WD/WN,  NAD Head: [place]/AT, No temporalis wasting.  Ear/Nose/Throat: Hearing grossly intact, nares w/o erythema or drainage Eyes: PER, EOMI, sclera nonicteric.  Neck: Supple, no masses.  No bruit or JVD.  Pulmonary:  Good air movement, no audible wheezing, no use of accessory muscles.  Cardiac: RRR, normal S1, S2, no Murmurs. Vascular:  mild trophic changes, no open wounds Vessel Right Left  Radial Palpable Palpable  PT Not Palpable Not Palpable  DP Not Palpable Not Palpable  Gastrointestinal: soft, non-distended. No guarding/no peritoneal signs.  Musculoskeletal: M/S 5/5 throughout.  No visible deformity.  Neurologic: CN 2-12 intact. Pain and light touch intact in extremities.  Symmetrical.  Speech is fluent. Motor exam as listed above. Psychiatric: Judgment intact, Mood & affect appropriate for pt's clinical situation. Dermatologic: No rashes or ulcers noted.  No changes consistent with cellulitis.   CBC Lab Results  Component Value Date   WBC 6.8 09/05/2020   HGB 12.5 09/05/2020   HCT 37.7 09/05/2020   MCV 95.4 09/05/2020   PLT 195 09/05/2020    BMET    Component Value Date/Time   NA 137 03/04/2018 2052   NA 134 (L) 11/02/2013 1011   NA 135 07/25/2010 0844   K 4.6 03/04/2018 2052   K 4.4 11/02/2013 1011   K 4.7 07/25/2010 0844   CL 102 03/04/2018 2052   CL 100 11/02/2013 1011   CL 97 (L) 07/25/2010 0844   CO2 27 03/04/2018 2052   CO2 31 11/02/2013 1011   CO2 27 07/25/2010 0844   GLUCOSE 108 (H) 03/04/2018 2052   GLUCOSE 120 (H) 11/02/2013 1011   GLUCOSE 137 (H) 07/25/2010 0844   BUN 13 03/04/2018 2052   BUN 22 (H) 11/02/2013 1011   BUN 27 (H) 07/25/2010 0844   CREATININE 1.06 (H) 03/04/2018 2052   CREATININE 1.22 11/02/2013 1011   CREATININE 1.1 07/25/2010 0844   CALCIUM 9.6 03/04/2018 2052   CALCIUM 8.7 11/02/2013 1011   CALCIUM 9.0 07/25/2010 0844   GFRNONAA 50 (L) 03/04/2018 2052   GFRNONAA 45 (L) 11/02/2013 1011   GFRAA 58 (L) 03/04/2018 2052   GFRAA 52 (L)  11/02/2013 1011   CrCl cannot be calculated (Patient's most recent lab result is older than the maximum 21 days allowed.).  COAG No results found for: "INR", "PROTIME"  Radiology No results found.   Assessment/Plan There are no diagnoses linked to this encounter.   Levora Dredge, MD  09/03/2022 3:07 PM

## 2022-09-04 ENCOUNTER — Other Ambulatory Visit (INDEPENDENT_AMBULATORY_CARE_PROVIDER_SITE_OTHER): Payer: Self-pay | Admitting: Vascular Surgery

## 2022-09-04 DIAGNOSIS — I7143 Infrarenal abdominal aortic aneurysm, without rupture: Secondary | ICD-10-CM

## 2022-09-04 DIAGNOSIS — I6523 Occlusion and stenosis of bilateral carotid arteries: Secondary | ICD-10-CM

## 2022-09-08 ENCOUNTER — Encounter (INDEPENDENT_AMBULATORY_CARE_PROVIDER_SITE_OTHER): Payer: Medicare HMO

## 2022-09-08 ENCOUNTER — Encounter (INDEPENDENT_AMBULATORY_CARE_PROVIDER_SITE_OTHER): Payer: Self-pay

## 2022-09-08 ENCOUNTER — Other Ambulatory Visit (INDEPENDENT_AMBULATORY_CARE_PROVIDER_SITE_OTHER): Payer: Medicare Other

## 2022-09-08 ENCOUNTER — Ambulatory Visit (INDEPENDENT_AMBULATORY_CARE_PROVIDER_SITE_OTHER): Payer: Medicare Other | Admitting: Vascular Surgery

## 2022-09-08 DIAGNOSIS — I6523 Occlusion and stenosis of bilateral carotid arteries: Secondary | ICD-10-CM

## 2022-09-08 DIAGNOSIS — I712 Thoracic aortic aneurysm, without rupture, unspecified: Secondary | ICD-10-CM

## 2022-09-08 DIAGNOSIS — I10 Essential (primary) hypertension: Secondary | ICD-10-CM

## 2022-09-08 DIAGNOSIS — I25118 Atherosclerotic heart disease of native coronary artery with other forms of angina pectoris: Secondary | ICD-10-CM

## 2022-09-08 DIAGNOSIS — I70213 Atherosclerosis of native arteries of extremities with intermittent claudication, bilateral legs: Secondary | ICD-10-CM

## 2022-10-31 ENCOUNTER — Other Ambulatory Visit: Payer: Self-pay

## 2022-10-31 ENCOUNTER — Emergency Department: Payer: Medicare Other

## 2022-10-31 ENCOUNTER — Encounter: Payer: Self-pay | Admitting: Radiology

## 2022-10-31 ENCOUNTER — Inpatient Hospital Stay: Payer: Medicare Other

## 2022-10-31 ENCOUNTER — Inpatient Hospital Stay
Admission: EM | Admit: 2022-10-31 | Discharge: 2022-11-02 | DRG: 065 | Disposition: A | Discharge status: Home / Self Care | Payer: Medicare Other | Attending: Internal Medicine | Admitting: Internal Medicine

## 2022-10-31 DIAGNOSIS — E663 Overweight: Secondary | ICD-10-CM | POA: Diagnosis present

## 2022-10-31 DIAGNOSIS — J449 Chronic obstructive pulmonary disease, unspecified: Secondary | ICD-10-CM | POA: Diagnosis present

## 2022-10-31 DIAGNOSIS — I7143 Infrarenal abdominal aortic aneurysm, without rupture: Secondary | ICD-10-CM | POA: Diagnosis present

## 2022-10-31 DIAGNOSIS — I6930 Unspecified sequelae of cerebral infarction: Secondary | ICD-10-CM

## 2022-10-31 DIAGNOSIS — M539 Dorsopathy, unspecified: Secondary | ICD-10-CM | POA: Insufficient documentation

## 2022-10-31 DIAGNOSIS — Z823 Family history of stroke: Secondary | ICD-10-CM | POA: Diagnosis not present

## 2022-10-31 DIAGNOSIS — R29810 Facial weakness: Secondary | ICD-10-CM | POA: Diagnosis present

## 2022-10-31 DIAGNOSIS — I6389 Other cerebral infarction: Secondary | ICD-10-CM | POA: Diagnosis not present

## 2022-10-31 DIAGNOSIS — Z720 Tobacco use: Secondary | ICD-10-CM | POA: Diagnosis not present

## 2022-10-31 DIAGNOSIS — E269 Hyperaldosteronism, unspecified: Secondary | ICD-10-CM | POA: Diagnosis present

## 2022-10-31 DIAGNOSIS — Z833 Family history of diabetes mellitus: Secondary | ICD-10-CM | POA: Diagnosis not present

## 2022-10-31 DIAGNOSIS — E119 Type 2 diabetes mellitus without complications: Secondary | ICD-10-CM | POA: Diagnosis present

## 2022-10-31 DIAGNOSIS — Z7902 Long term (current) use of antithrombotics/antiplatelets: Secondary | ICD-10-CM

## 2022-10-31 DIAGNOSIS — Z888 Allergy status to other drugs, medicaments and biological substances status: Secondary | ICD-10-CM

## 2022-10-31 DIAGNOSIS — K5190 Ulcerative colitis, unspecified, without complications: Secondary | ICD-10-CM | POA: Diagnosis present

## 2022-10-31 DIAGNOSIS — Z6825 Body mass index (BMI) 25.0-25.9, adult: Secondary | ICD-10-CM

## 2022-10-31 DIAGNOSIS — F17210 Nicotine dependence, cigarettes, uncomplicated: Secondary | ICD-10-CM | POA: Diagnosis present

## 2022-10-31 DIAGNOSIS — R202 Paresthesia of skin: Secondary | ICD-10-CM

## 2022-10-31 DIAGNOSIS — I639 Cerebral infarction, unspecified: Secondary | ICD-10-CM | POA: Diagnosis present

## 2022-10-31 DIAGNOSIS — I714 Abdominal aortic aneurysm, without rupture, unspecified: Secondary | ICD-10-CM | POA: Diagnosis present

## 2022-10-31 DIAGNOSIS — I6523 Occlusion and stenosis of bilateral carotid arteries: Secondary | ICD-10-CM | POA: Diagnosis present

## 2022-10-31 DIAGNOSIS — E785 Hyperlipidemia, unspecified: Secondary | ICD-10-CM | POA: Diagnosis present

## 2022-10-31 DIAGNOSIS — I10 Essential (primary) hypertension: Secondary | ICD-10-CM | POA: Diagnosis present

## 2022-10-31 DIAGNOSIS — K219 Gastro-esophageal reflux disease without esophagitis: Secondary | ICD-10-CM | POA: Diagnosis present

## 2022-10-31 DIAGNOSIS — Z955 Presence of coronary angioplasty implant and graft: Secondary | ICD-10-CM

## 2022-10-31 DIAGNOSIS — Z79899 Other long term (current) drug therapy: Secondary | ICD-10-CM

## 2022-10-31 DIAGNOSIS — R29703 NIHSS score 3: Secondary | ICD-10-CM | POA: Diagnosis present

## 2022-10-31 DIAGNOSIS — Q619 Cystic kidney disease, unspecified: Secondary | ICD-10-CM | POA: Diagnosis not present

## 2022-10-31 DIAGNOSIS — M4802 Spinal stenosis, cervical region: Secondary | ICD-10-CM | POA: Diagnosis present

## 2022-10-31 DIAGNOSIS — I63511 Cerebral infarction due to unspecified occlusion or stenosis of right middle cerebral artery: Principal | ICD-10-CM | POA: Diagnosis present

## 2022-10-31 DIAGNOSIS — Z91041 Radiographic dye allergy status: Secondary | ICD-10-CM

## 2022-10-31 DIAGNOSIS — E039 Hypothyroidism, unspecified: Secondary | ICD-10-CM | POA: Diagnosis present

## 2022-10-31 DIAGNOSIS — Z7951 Long term (current) use of inhaled steroids: Secondary | ICD-10-CM

## 2022-10-31 DIAGNOSIS — I2510 Atherosclerotic heart disease of native coronary artery without angina pectoris: Secondary | ICD-10-CM | POA: Diagnosis present

## 2022-10-31 DIAGNOSIS — R471 Dysarthria and anarthria: Secondary | ICD-10-CM | POA: Diagnosis not present

## 2022-10-31 DIAGNOSIS — F419 Anxiety disorder, unspecified: Secondary | ICD-10-CM | POA: Diagnosis present

## 2022-10-31 DIAGNOSIS — Z8249 Family history of ischemic heart disease and other diseases of the circulatory system: Secondary | ICD-10-CM | POA: Diagnosis not present

## 2022-10-31 DIAGNOSIS — Z885 Allergy status to narcotic agent status: Secondary | ICD-10-CM

## 2022-10-31 DIAGNOSIS — Z91013 Allergy to seafood: Secondary | ICD-10-CM

## 2022-10-31 DIAGNOSIS — Z79890 Hormone replacement therapy (postmenopausal): Secondary | ICD-10-CM

## 2022-10-31 DIAGNOSIS — R4781 Slurred speech: Secondary | ICD-10-CM | POA: Diagnosis present

## 2022-10-31 LAB — DIFFERENTIAL
Abs Immature Granulocytes: 0.02 10*3/uL (ref 0.00–0.07)
Basophils Absolute: 0 10*3/uL (ref 0.0–0.1)
Basophils Relative: 0 %
Eosinophils Absolute: 0.1 10*3/uL (ref 0.0–0.5)
Eosinophils Relative: 2 %
Immature Granulocytes: 0 %
Lymphocytes Relative: 24 %
Lymphs Abs: 1.7 10*3/uL (ref 0.7–4.0)
Monocytes Absolute: 0.5 10*3/uL (ref 0.1–1.0)
Monocytes Relative: 7 %
Neutro Abs: 4.6 10*3/uL (ref 1.7–7.7)
Neutrophils Relative %: 67 %

## 2022-10-31 LAB — URINE DRUG SCREEN, QUALITATIVE (ARMC ONLY)
Amphetamines, Ur Screen: NOT DETECTED
Barbiturates, Ur Screen: NOT DETECTED
Benzodiazepine, Ur Scrn: NOT DETECTED
Cannabinoid 50 Ng, Ur ~~LOC~~: NOT DETECTED
Cocaine Metabolite,Ur ~~LOC~~: NOT DETECTED
MDMA (Ecstasy)Ur Screen: NOT DETECTED
Methadone Scn, Ur: NOT DETECTED
Opiate, Ur Screen: NOT DETECTED
Phencyclidine (PCP) Ur S: NOT DETECTED
Tricyclic, Ur Screen: NOT DETECTED

## 2022-10-31 LAB — COMPREHENSIVE METABOLIC PANEL
ALT: 11 U/L (ref 0–44)
AST: 28 U/L (ref 15–41)
Albumin: 4 g/dL (ref 3.5–5.0)
Alkaline Phosphatase: 55 U/L (ref 38–126)
Anion gap: 8 (ref 5–15)
BUN: 15 mg/dL (ref 8–23)
CO2: 27 mmol/L (ref 22–32)
Calcium: 8.9 mg/dL (ref 8.9–10.3)
Chloride: 106 mmol/L (ref 98–111)
Creatinine, Ser: 0.92 mg/dL (ref 0.44–1.00)
GFR, Estimated: >60 mL/min (ref >60)
Glucose, Bld: 135 mg/dL — ABNORMAL HIGH (ref 70–99)
Potassium: 4.3 mmol/L (ref 3.5–5.1)
Sodium: 141 mmol/L (ref 135–145)
Total Bilirubin: 1 mg/dL (ref 0.3–1.2)
Total Protein: 6.9 g/dL (ref 6.5–8.1)

## 2022-10-31 LAB — CBG MONITORING, ED
Glucose-Capillary: 131 mg/dL — ABNORMAL HIGH (ref 70–99)
Glucose-Capillary: 92 mg/dL (ref 70–99)

## 2022-10-31 LAB — URINALYSIS, ROUTINE W REFLEX MICROSCOPIC
Bilirubin Urine: NEGATIVE
Glucose, UA: NEGATIVE mg/dL
Hgb urine dipstick: NEGATIVE
Ketones, ur: NEGATIVE mg/dL
Leukocytes,Ua: NEGATIVE
Nitrite: NEGATIVE
Protein, ur: NEGATIVE mg/dL
Specific Gravity, Urine: 1.004 — ABNORMAL LOW (ref 1.005–1.030)
pH: 7 (ref 5.0–8.0)

## 2022-10-31 LAB — PROTIME-INR
INR: 1 (ref 0.8–1.2)
Prothrombin Time: 13 seconds (ref 11.4–15.2)

## 2022-10-31 LAB — CBC
HCT: 42.3 % (ref 36.0–46.0)
Hemoglobin: 13.5 g/dL (ref 12.0–15.0)
MCH: 31.5 pg (ref 26.0–34.0)
MCHC: 31.9 g/dL (ref 30.0–36.0)
MCV: 98.6 fL (ref 80.0–100.0)
Platelets: 176 10*3/uL (ref 150–400)
RBC: 4.29 MIL/uL (ref 3.87–5.11)
RDW: 12.6 % (ref 11.5–15.5)
WBC: 7 10*3/uL (ref 4.0–10.5)
nRBC: 0 % (ref 0.0–0.2)

## 2022-10-31 LAB — APTT: aPTT: 28 seconds (ref 24–36)

## 2022-10-31 MED ORDER — ACETAMINOPHEN 325 MG PO TABS
650.0000 mg | ORAL_TABLET | ORAL | Status: DC | PRN
  Administered 2022-11-01: 650 mg via ORAL
  Filled 2022-10-31: qty 2

## 2022-10-31 MED ORDER — ACETAMINOPHEN 160 MG/5ML PO SOLN: 650.0000 mg | ORAL | Status: DC | PRN

## 2022-10-31 MED ORDER — ACETAMINOPHEN 650 MG RE SUPP: 650.0000 mg | RECTAL | Status: DC | PRN

## 2022-10-31 MED ORDER — ASPIRIN 300 MG RE SUPP
300.0000 mg | Freq: Every day | RECTAL | Status: DC
  Filled 2022-10-31: qty 1

## 2022-10-31 MED ORDER — INSULIN ASPART 100 UNIT/ML IJ SOLN
0.0000 [IU] | Freq: Three times a day (TID) | INTRAMUSCULAR | Status: DC
  Administered 2022-11-01 – 2022-11-02 (×2): 2 [IU] via SUBCUTANEOUS
  Filled 2022-10-31 (×2): qty 1

## 2022-10-31 MED ORDER — HYDROCODONE-ACETAMINOPHEN 5-325 MG PO TABS: 1.0000 | ORAL_TABLET | Freq: Four times a day (QID) | ORAL | Status: DC | PRN

## 2022-10-31 MED ORDER — ASPIRIN 81 MG PO CHEW
81.0000 mg | CHEWABLE_TABLET | Freq: Every day | ORAL | Status: DC
  Administered 2022-10-31 – 2022-11-02 (×3): 81 mg via ORAL
  Filled 2022-10-31 (×3): qty 1

## 2022-10-31 MED ORDER — INSULIN ASPART 100 UNIT/ML IJ SOLN: 0.0000 [IU] | Freq: Every day | INTRAMUSCULAR | Status: DC

## 2022-10-31 MED ORDER — STROKE: EARLY STAGES OF RECOVERY BOOK: Freq: Once | Status: DC

## 2022-10-31 MED ORDER — GADOBUTROL 1 MMOL/ML IV SOLN
7.0000 mL | Freq: Once | INTRAVENOUS | Status: AC | PRN
  Administered 2022-10-31: 7 mL via INTRAVENOUS

## 2022-10-31 MED ORDER — LEVOTHYROXINE SODIUM 100 MCG PO TABS
100.0000 ug | ORAL_TABLET | Freq: Every day | ORAL | Status: DC
  Administered 2022-11-01 – 2022-11-02 (×2): 100 ug via ORAL
  Filled 2022-10-31 (×2): qty 1

## 2022-10-31 MED ORDER — ENOXAPARIN SODIUM 40 MG/0.4ML IJ SOSY
40.0000 mg | PREFILLED_SYRINGE | INTRAMUSCULAR | Status: DC
  Administered 2022-11-01: 40 mg via SUBCUTANEOUS
  Filled 2022-10-31 (×2): qty 0.4

## 2022-10-31 MED ORDER — FLUTICASONE FUROATE-VILANTEROL 100-25 MCG/ACT IN AEPB
1.0000 | INHALATION_SPRAY | Freq: Every day | RESPIRATORY_TRACT | Status: DC
  Administered 2022-11-01: 1 via RESPIRATORY_TRACT
  Filled 2022-10-31: qty 28

## 2022-10-31 MED ORDER — CLOPIDOGREL BISULFATE 75 MG PO TABS
75.0000 mg | ORAL_TABLET | Freq: Every day | ORAL | Status: DC
  Administered 2022-11-01 – 2022-11-02 (×2): 75 mg via ORAL
  Filled 2022-10-31 (×2): qty 1

## 2022-10-31 MED ORDER — ALBUTEROL SULFATE (2.5 MG/3ML) 0.083% IN NEBU: 2.5000 mg | INHALATION_SOLUTION | Freq: Four times a day (QID) | RESPIRATORY_TRACT | Status: DC | PRN

## 2022-10-31 NOTE — ED Notes (Signed)
Patient reveals pmh of hemorrhagic stroke which disqualifies her to rec tPA.

## 2022-10-31 NOTE — Assessment & Plan Note (Signed)
Not acutely exacerbated Continue home Breo Ellipta Albuterol as needed

## 2022-10-31 NOTE — Progress Notes (Signed)
Responded to Code Stroke. Upon arrival patient was being cared for by staff, no family present. Asked to be paged if and when support is needed.

## 2022-10-31 NOTE — Progress Notes (Signed)
1922 Code Stroke cart activated, pt already back from CT. Son states LKW 1830, slurred speech, L facial droop, and mild confusion. H/O ICH with peripheral vision loss 1924 Teleneuro paged 1928 Dr Selina Cooley on camera

## 2022-10-31 NOTE — ED Notes (Signed)
Report received from [place], rn at this time. States patient is in MRI at the moment and then will be coming to room 37.

## 2022-10-31 NOTE — ED Triage Notes (Addendum)
Pt BIB [facility] from home initial c/o due to concerns for stroke like symptoms Pt c/o generalized weakness throughout the day. In triage pt has facial drop with dizziness, and slurred speech. History of CVA approx 6 years prior. LKW 30 minutes PTA. Stat CT @ 1914

## 2022-10-31 NOTE — ED Notes (Signed)
Report to Summer in C-Pod

## 2022-10-31 NOTE — Assessment & Plan Note (Addendum)
Remote RCA stent last cath 2010  Patient denies chest pain, EKG nonacute Started on aspirin and resumed on Plavix

## 2022-10-31 NOTE — Consult Note (Signed)
LKW 1830  L facial droop, slurred speech, mild confusion  NEUROLOGY TELECONSULTATION NOTE   Date of service: October 31, 2022 Patient Name: Rhonda Park MRN:  [id number] DOB:  1941-01-25 Reason for consult: telestroke  Requesting Provider: Dr. Dionne Bucy Consult Participants: myself, patient, bedside RN, telestroke RN, patient's son Location of the provider: [facility], [place] Location of the patient: [facility]  This consult was provided via telemedicine with 2-way video and audio communication. The patient/family was informed that care would be provided in this way and agreed to receive care in this manner.   _ _ _   _ __   _ __ _ _  __ __   _ __   __ _  History of Present Illness   This is a 81 yo woman with hx COPD, CAD, HTN, HL, DM2, congenital cystic kidney disease, hemorrhagic stroke per patient 6 years ago who presents with L face numbness and droop. She woke up feeling a little weak all over and was tired all day. At 1630 LKW she developed L face numbness and facial droop on that side. NIHSS = 3 for facial droop, sensory deficit, and dysarthria. CT head personally reviewed, no acute abnormality, ASPECTS 10, remote L PCA infarct. TNK not administered 2/2 contraindication of history of hemorrhagic stroke. CTA not performed 2/2 exam not c/w LVO.   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 Remote RCA stent;  b. Most recent cardiac catheterization in May of 2010 showed an EF of 65% with 50% ostial left main stenosis and 90% distal LAD stenosis close to the apex;  c. 11/2015 Low risk MV w/ subtle antlat ischemia; d. 08/2017 MV: No ischemia/infarct. Low Risk. EF  55-65%.   Diastolic dysfunction    a. 11/2015 Echo: EF 60-65%, Gr1 DD, mild AI, midly dil LA.   DOE (dyspnea on exertion)    a. With wheezing - does not like to use inhalers.   Essential hypertension    a. With evidence of hyperaldosteronism with severe hypokalemia. 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   GERD (gastroesophageal reflux disease)    Headache(784.0)    History of blood transfusion    30 units child birth   Hyperlipidemia    Hypothyroidism    Type II or unspecified type diabetes mellitus without mention of complication, not stated as uncontrolled    Not on medications   UC (ulcerative colitis) (HCC)    Unspecified congenital cystic kidney disease    Past Surgical History:  Procedure Laterality Date   ABDOMINAL HYSTERECTOMY     ANTERIOR CERVICAL DECOMP/DISCECTOMY FUSION N/A 05/23/2014   Procedure: CERVICAL FIVE SIX ANTERIOR CERVICAL DECOMPRESSION/DISCECTOMY FUSION 1 LEVEL;  Surgeon: Temple Pacini, MD;  Location: [facility];  Service: Neurosurgery;  Laterality: N/A;   APPENDECTOMY     BACK SURGERY     BREAST CYST EXCISION Left    3   CARDIAC CATHETERIZATION  03/2009   [facility]   CARDIAC CATHETERIZATION  05/2007   [facility]   CARDIAC CATHETERIZATION  05/2009   ''   CARDIAC CATHETERIZATION  07/2005   ''  CHOLECYSTECTOMY     hysterectomy     KNEE SURGERY Right    "growth"   LUMBAR LAMINECTOMY/DECOMPRESSION MICRODISCECTOMY Right 11/30/2015   Procedure: LUMBAR LAMINECTOMY/DECOMPRESSION MICRODISCECTOMY 1 LEVEL;  Surgeon: Earnie Larsson, MD;  Location: [place] [facility];  Service: Neurosurgery;  Laterality: Right;   posterior segment     unlisted procedure   TONSILLECTOMY     Family History  Problem Relation Age of Onset   Heart attack Mother    Diabetes Mother    Hypertension Mother    Stroke Mother    Hypertension Father    Diabetes Maternal Grandmother    Diabetes Maternal Grandfather    Diabetes Sister    Diabetes Brother    Hypertension Sister    Hypertension  Brother    Hypertension Son    Stroke Son    Social History   Socioeconomic History   Marital status: Single    Spouse name: Not on file   Number of children: Not on file   Years of education: Not on file   Highest education level: Not on file  Occupational History   Not on file  Tobacco Use   Smoking status: Every Day    Packs/day: 0.02    Types: Cigarettes   Smokeless tobacco: Never  Substance and Sexual Activity   Alcohol use: No   Drug use: No   Sexual activity: Not on file  Other Topics Concern   Not on file  Social History Narrative   Not on file   Social Determinants of Health   Financial Resource Strain: Not on file  Food Insecurity: Not on file  Transportation Needs: Not on file  Physical Activity: Not on file  Stress: Not on file  Social Connections: Not on file   Allergies  Allergen Reactions   Breo Ellipta [Fluticasone Furoate-Vilanterol] Palpitations   Demerol [Meperidine] Other (See Comments)    Other Reaction: CNS Disorder   Codeine     Reports that she gets jittery and does not feel right able to tolerate hydrocodone   Ivp Dye [Iodinated Contrast Media] Hives   Shellfish Allergy Hives   Statins Other (See Comments)    myalgia myalgia    Medications   (Not in a hospital admission)    No current facility-administered medications for this encounter.  Current Outpatient Medications:    albuterol (VENTOLIN HFA) 108 (90 Base) MCG/ACT inhaler, Inhale 1-2 puffs into the lungs every 6 (six) hours as needed for wheezing or shortness of breath., Disp: 8 g, Rfl: 1   amLODipine (NORVASC) 10 MG tablet, Take 1 tablet (10 mg total) by mouth daily., Disp: 90 tablet, Rfl: 3   amLODipine (NORVASC) 10 MG tablet, Take by mouth., Disp: , Rfl:    b complex vitamins tablet, Take 1 tablet by mouth daily., Disp: , Rfl:    Cholecalciferol (VITAMIN D-3) 5000 UNITS TABS, Take 5,000 Units by mouth daily. , Disp: , Rfl:    clonazePAM (KLONOPIN) 1 MG tablet, Take 1 mg  by mouth 2 (two) times daily., Disp: , Rfl:    clonazePAM (KLONOPIN) 1 MG tablet, Take 1 tablet by mouth 2 (two) times daily as needed., Disp: , Rfl:    clopidogrel (PLAVIX) 75 MG tablet, Take 1 tablet (75 mg total) by mouth daily with breakfast., Disp: 90 tablet, Rfl: 3   fluticasone furoate-vilanterol (BREO ELLIPTA) 100-25 MCG/INH AEPB, Inhale 1 puff into the lungs daily., Disp: , Rfl:    HYDROcodone-acetaminophen (NORCO/VICODIN) 5-325 MG tablet, Take 1 tablet by  mouth every 6 (six) hours as needed for moderate pain., Disp: , Rfl:    isosorbide mononitrate (IMDUR) 60 MG 24 hr tablet, Take 1.5 tablets (90 mg total) by mouth daily., Disp: 90 tablet, Rfl: 6   JANUVIA 100 MG tablet, Take 100 mg by mouth daily. , Disp: , Rfl: 1   levothyroxine (SYNTHROID) 100 MCG tablet, Take by mouth., Disp: , Rfl:    levothyroxine (SYNTHROID, LEVOTHROID) 100 MCG tablet, Take 100 mcg by mouth daily before breakfast., Disp: , Rfl:    metoprolol succinate (TOPROL-XL) 50 MG 24 hr tablet, Take 50 mg by mouth daily. Take with or immediately following a meal., Disp: , Rfl:    ondansetron (ZOFRAN) 4 MG tablet, Take 4 mg by mouth every 8 (eight) hours as needed for nausea or vomiting., Disp: , Rfl:    sertraline (ZOLOFT) 100 MG tablet, Take 100 mg by mouth 2 (two) times daily. , Disp: , Rfl:    sertraline (ZOLOFT) 100 MG tablet, Take by mouth., Disp: , Rfl:    spironolactone (ALDACTONE) 25 MG tablet, Take 25 mg by mouth 2 (two) times daily. , Disp: , Rfl:   Vitals   Vitals:   10/31/22 1927  Temp: 97.9 F (36.6 C)  TempSrc: Oral     There is no height or weight on file to calculate BMI.  Physical Exam   Exam performed over telemedicine with 2-way video and audio communication and with assistance of bedside RN  Physical Exam Gen: A&O x4, NAD Resp: normal WOB CV: extremities appear well-perfused  Neuro: *MS: A&O x4. Follows multi-step commands.  *Speech: mild dysarthria, no aphasia, able to name and  repeat *CN: PERRL 70mm, EOMI, not able to differentiate between 1 and 2 fingers in any quadrant of either eye, central vision intact, mild impairment to LT on L face, L UMN facial droop, hearing intact to voice *Motor:   Normal bulk.  No tremor, rigidity or bradykinesia. No pronator drift. All extremities appear full-strength and symmetric. *Sensory: SILT. Symmetric. No double-simultaneous extinction.  *Coordination:  FNF intact bilat *Reflexes:  UTA 2/2 tele-exam *Gait: deferred  NIHSS  1a Level of Conscious.: 0 1b LOC Questions: 0 1c LOC Commands: 0 2 Best Gaze: 0 3 Visual: 0 4 Facial Palsy: 1 5a Motor Arm - left: 0 5b Motor Arm - Right: 0 6a Motor Leg - Left: 0 6b Motor Leg - Right: 0 7 Limb Ataxia: 0 8 Sensory: 1 9 Best Language: 0 10 Dysarthria: 1 11 Extinct. and Inatten.: 0  TOTAL: 3   Premorbid mRS = 1   Labs   CBC:  Recent Labs  Lab 10/31/22 1920  WBC 7.0  NEUTROABS 4.6  HGB 13.5  HCT 42.3  MCV 98.6  PLT 0000000    Basic Metabolic Panel:  Lab Results  Component Value Date   NA 137 03/04/2018   K 4.6 03/04/2018   CO2 27 03/04/2018   GLUCOSE 108 (H) 03/04/2018   BUN 13 03/04/2018   CREATININE 1.06 (H) 03/04/2018   CALCIUM 9.6 03/04/2018   GFRNONAA 50 (L) 03/04/2018   GFRAA 58 (L) 03/04/2018   Lipid Panel:  Lab Results  Component Value Date   LDLCALC 124 (H) 10/13/2013   HgbA1c:  Lab Results  Component Value Date   HGBA1C 5.8 (H) 11/08/2015   Urine Drug Screen:     Component Value Date/Time   LABOPIA NONE DETECTED 03/04/2018 2052   COCAINSCRNUR NONE DETECTED 03/04/2018 2052   LABBENZ NONE DETECTED 03/04/2018 2052  AMPHETMU NONE DETECTED 03/04/2018 2052   THCU NONE DETECTED 03/04/2018 2052   LABBARB NONE DETECTED 03/04/2018 2052    Alcohol Level     Component Value Date/Time   [facility] <10 03/04/2018 2052     Impression   This is a 81 yo woman with hx COPD, CAD, HTN, HL, DM2, congenital cystic kidney disease, hemorrhagic stroke per  patient 6 years ago who presents with L face numbness and L facial droop with dysarthria since 1630 c/f acute ischemic stroke. NIHSS = 3 for facial droop, sensory deficit, and dysarthria. CT head no acute abnormality, ASPECTS 10, remote L PCA infarct. TNK not administered 2/2 contraindication of history of hemorrhagic stroke.    Recommendations   - Admit for stroke workup - Permissive HTN x48 hrs from sx onset or until stroke ruled out by MRI goal BP <220/110. PRN labetalol or hydralazine if BP above these parameters. Avoid oral antihypertensives. - MRI brain wo contrast - MRA H&N - TTE - Check A1c and LDL + add statin per guidelines - ASA 81mg  daily + plavix 75mg  daily x21 days f/b plavix 75mg  daily monotherapy after that - NPO until RN dysphagia screen passed and documented - q4 hr neuro checks - STAT head CT for any change in neuro exam - Tele - PT/OT/SLP - Stroke education - Amb referral to neurology upon discharge   ______________________________________________________________________   Thank you for the opportunity to take part in the care of this patient. If you have any further questions, please contact the neurology consultation attending.  Signed,  Su Monks, MD [facility] [phone]  If 7pm- 7am, please page neurology on call as listed in [place].  **Any copied and pasted documentation in this note was written by me in another application not billed for and pasted by me into this document.

## 2022-10-31 NOTE — ED Provider Notes (Signed)
[facility] Provider Note    Event Date/Time   First MD Initiated Contact with Patient 10/31/22 1927     (approximate)   History   Code Stroke   HPI  Rhonda Park is a 81 y.o. female with a history of CVA, CAD with stents, anemia, arthritis, hypertension, hyperlipidemia, and GERD who presents with left facial droop acute onset this evening associated with difficulty speaking.  She also reports generalized weakness throughout the day today.  She denies any severe headache, vision changes, or weakness or numbness in the arms or legs.  I reviewed the past medical records.  The patient's most recent outpatient encounter was with Dr. Gilda Crease from vascular surgery in October 2022 for evaluation of carotid stenosis.  She was recommended for medical management at that time.     Physical Exam   Triage Vital Signs: ED Triage Vitals  Enc Vitals Group     BP --      Pulse --      Resp --      Temp 10/31/22 1927 97.9 F (36.6 C)     Temp Source 10/31/22 1927 Oral     SpO2 --      Weight --      Height --      Head Circumference --      Peak Flow --      Pain Score 10/31/22 1924 0     Pain Loc --      Pain Edu? --      Excl. in GC? --     Most recent vital signs: Vitals:   10/31/22 2200 10/31/22 2314  BP: (!) 172/64 (!) 189/65  Pulse: (!) 58 60  Resp: 20 18  Temp:  98 F (36.7 C)  SpO2: 96% 98%     General: Alert and oriented, comfortable appearing. CV:  Good peripheral perfusion.  Resp:  Normal effort.  Abd:  No distention.  Other:  EOMI.  PERRLA.  Left facial droop.  Dysarthric speech.  5/5 motor strength and intact sensation to bilateral upper and lower extremities.   ED Results / Procedures / Treatments   Labs (all labs ordered are listed, but only abnormal results are displayed) Labs Reviewed  COMPREHENSIVE METABOLIC PANEL - Abnormal; Notable for the following components:      Result Value   Glucose, Bld 135 (*)    All other  components within normal limits  URINALYSIS, ROUTINE W REFLEX MICROSCOPIC - Abnormal; Notable for the following components:   Color, Urine COLORLESS (*)    APPearance CLEAR (*)    Specific Gravity, Urine 1.004 (*)    All other components within normal limits  CBG MONITORING, ED - Abnormal; Notable for the following components:   Glucose-Capillary 131 (*)    All other components within normal limits  PROTIME-INR  APTT  CBC  DIFFERENTIAL  URINE DRUG SCREEN, QUALITATIVE ([facility] ONLY)  ETHANOL  LIPID PANEL  HEMOGLOBIN A1C  CBG MONITORING, ED     EKG  ED ECG REPORT I, Dionne Bucy, the attending physician, personally viewed and interpreted this ECG.  Date: 10/31/2022 EKG Time: 1926 Rate: 68 Rhythm: normal sinus rhythm QRS Axis: normal Intervals: normal ST/T Wave abnormalities: Repolarization abnormality Narrative Interpretation: no evidence of acute ischemia    RADIOLOGY  CT head: I independently viewed and interpreted the images; there is no ICH.  Radiology report indicates no acute findings.  PROCEDURES:  Critical Care performed: Yes, see critical care procedure note(s)  .  Critical Care  Performed by: Dionne Bucy, MD Authorized by: Dionne Bucy, MD   Critical care provider statement:    Critical care time (minutes):  30   Critical care was necessary to treat or prevent imminent or life-threatening deterioration of the following conditions:  CNS failure or compromise   Critical care was time spent personally by me on the following activities:  Development of treatment plan with patient or surrogate, discussions with consultants, evaluation of patient's response to treatment, examination of patient, ordering and review of laboratory studies, ordering and review of radiographic studies, ordering and performing treatments and interventions, pulse oximetry, re-evaluation of patient's condition, review of old charts and obtaining history from patient or  surrogate   Care discussed with: admitting provider      MEDICATIONS ORDERED IN ED: Medications   stroke: early stages of recovery book (has no administration in time range)  aspirin chewable tablet 81 mg (81 mg Oral Given 10/31/22 2022)    Or  aspirin suppository 300 mg ( Rectal See Alternative 10/31/22 2022)  clopidogrel (PLAVIX) tablet 75 mg (has no administration in time range)  HYDROcodone-acetaminophen (NORCO/VICODIN) 5-325 MG per tablet 1 tablet (has no administration in time range)  levothyroxine (SYNTHROID) tablet 100 mcg (has no administration in time range)  fluticasone furoate-vilanterol (BREO ELLIPTA) 100-25 MCG/INH 1 puff (has no administration in time range)  albuterol (PROVENTIL) (2.5 MG/3ML) 0.083% nebulizer solution 2.5 mg (has no administration in time range)   stroke: early stages of recovery book (has no administration in time range)  acetaminophen (TYLENOL) tablet 650 mg (has no administration in time range)    Or  acetaminophen (TYLENOL) 160 MG/5ML solution 650 mg (has no administration in time range)    Or  acetaminophen (TYLENOL) suppository 650 mg (has no administration in time range)  enoxaparin (LOVENOX) injection 40 mg (has no administration in time range)  insulin aspart (novoLOG) injection 0-15 Units (has no administration in time range)  insulin aspart (novoLOG) injection 0-5 Units ( Subcutaneous Not Given 10/31/22 2320)  gadobutrol (GADAVIST) 1 MMOL/ML injection 7 mL (7 mLs Intravenous Contrast Given 10/31/22 2253)     IMPRESSION / MDM / ASSESSMENT AND PLAN / ED COURSE  I reviewed the triage vital signs and the nursing notes.  81 year old female with PMH as noted above presents with acute onset of left facial droop, numbness, dysarthria since 4:30 PM.  Physical exam reveals left facial droop and dysarthria.  Differential diagnosis includes, but is not limited to, acute CVA, TIA, complex migraine.  Code stroke was activated and the patient was  evaluated by Dr. Selina Cooley via teleneurology.  CT head was negative.  The patient has history of hemorrhagic stroke so TNK was contraindicated.  Patient's presentation is most consistent with acute presentation with potential threat to life or bodily function.  The patient is on the cardiac monitor to evaluate for evidence of arrhythmia and/or significant heart rate changes.  ----------------------------------------- 11:51 PM on 10/31/2022 -----------------------------------------  The patient remained stable in the ED.  Lab workup is overall unremarkable with normal electrolytes, no leukocytosis or anemia, and negative UA and drug screen.  I consulted Dr. Para March from the hospitalist service; based on discussion she agreed to admit the patient.   FINAL CLINICAL IMPRESSION(S) / ED DIAGNOSES   Final diagnoses:  Cerebrovascular accident (CVA), unspecified mechanism (HCC)     Rx / DC Orders   ED Discharge Orders     None        Note:  This  document was prepared using Conservation officer, historic buildings and may include unintentional dictation errors.    Dionne Bucy, MD 10/31/22 2351

## 2022-10-31 NOTE — ED Notes (Signed)
Activated Code Stroke w/ [facility], spoke w/ Cala Bradford

## 2022-10-31 NOTE — H&P (Signed)
History and Physical    Patient: Rhonda Park DOB: December 08, 1940 DOA: 10/31/2022 DOS: the patient was seen and examined on 10/31/2022 PCP: Remi Haggard, FNP  Patient coming from: Home  Chief Complaint:  Chief Complaint  Patient presents with   Code Stroke    HPI: Rhonda Park is a 81 y.o. 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  She was previously in her usual state of health except for next weakness on awakening on that day.  NIH 3 on arrival ED course and data review: On arrival BP 173/69 with otherwise normal vitals Head CT code stroke protocol called showed no evidence of acute intracranial abnormality Patient was seen in consultation by neurology, Dr. Quinn Axe who recommended no tPA given history of hemorrhagic stroke.  CTA not performed secondary to neurologic exam not being consistent with LVO.  Stroke recommendations given as will be outlined in assessment and plan. Additional data reviewed included blood work which included CBC, CMP, urinalysis and UDS all of which were unremarkable Patient was given aspirin suppository.  Hospitalist consulted for admission.   Review of Systems: As mentioned in the history of present illness. All other systems reviewed and are negative.  Past Medical History:  Diagnosis Date   Anemia    Anxiety    Arthritis    Cardiomegaly    Cerebellar stroke ([place]) 10/2013   memory loss   Cervicalgia    Chronic airway  obstruction, not elsewhere classified    Coronary artery disease    a. Remote RCA stent;  b. Most recent cardiac catheterization in May of 2010 showed an EF of 65% with 50% ostial left main stenosis and 90% distal LAD stenosis close to the apex;  c. 11/2015 Low risk MV w/ subtle antlat ischemia; d. 08/2017 MV: No ischemia/infarct. Low Risk. EF 55-65%.   Diastolic dysfunction    a. 11/2015 Echo: EF 60-65%, Gr1 DD, mild AI, midly dil LA.   DOE (dyspnea on exertion)    a. With wheezing - does not like to use inhalers.   Essential hypertension    a. With evidence of hyperaldosteronism with severe hypokalemia. 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   GERD (gastroesophageal reflux disease)    Headache(784.0)    History of blood transfusion    30 units child birth   Hyperlipidemia    Hypothyroidism    Type II or unspecified type diabetes mellitus without mention of complication, not stated as uncontrolled    Not on medications   UC (ulcerative colitis) ([place])    Unspecified congenital cystic kidney disease    Past Surgical History:  Procedure Laterality Date   ABDOMINAL HYSTERECTOMY     ANTERIOR CERVICAL DECOMP/DISCECTOMY FUSION N/A 05/23/2014   Procedure: CERVICAL FIVE SIX ANTERIOR CERVICAL DECOMPRESSION/DISCECTOMY FUSION 1 LEVEL;  Surgeon: Charlie Pitter, MD;  Location: [place] [facility];  Service: Neurosurgery;  Laterality: N/A;   APPENDECTOMY     BACK SURGERY     BREAST CYST EXCISION Left    3   CARDIAC CATHETERIZATION  03/2009  [facility]   CARDIAC CATHETERIZATION  05/2007   [facility]   CARDIAC CATHETERIZATION  05/2009   ''   CARDIAC CATHETERIZATION  07/2005   ''   CHOLECYSTECTOMY     hysterectomy     KNEE SURGERY Right    "growth"   LUMBAR LAMINECTOMY/DECOMPRESSION MICRODISCECTOMY Right 11/30/2015   Procedure: LUMBAR LAMINECTOMY/DECOMPRESSION MICRODISCECTOMY 1 LEVEL;  Surgeon: Earnie Larsson, MD;  Location: [place] [facility];  Service: Neurosurgery;  Laterality: Right;   posterior segment      unlisted procedure   TONSILLECTOMY     Social History:  reports that she has been smoking cigarettes. She has been smoking an average of .02 packs per day. She has never used smokeless tobacco. She reports that she does not drink alcohol and does not use drugs.  Allergies  Allergen Reactions   Breo Ellipta [Fluticasone Furoate-Vilanterol] Palpitations   Demerol [Meperidine] Other (See Comments)    Other Reaction: CNS Disorder   Codeine     Reports that she gets jittery and does not feel right able to tolerate hydrocodone   Ivp Dye [Iodinated Contrast Media] Hives   Shellfish Allergy Hives   Statins Other (See Comments)    myalgia myalgia    Family History  Problem Relation Age of Onset   Heart attack Mother    Diabetes Mother    Hypertension Mother    Stroke Mother    Hypertension Father    Diabetes Maternal Grandmother    Diabetes Maternal Grandfather    Diabetes Sister    Diabetes Brother    Hypertension Sister    Hypertension Brother    Hypertension Son    Stroke Son     Prior to Admission medications   Medication Sig Start Date End Date Taking? Authorizing Provider  albuterol (VENTOLIN HFA) 108 (90 Base) MCG/ACT inhaler Inhale 1-2 puffs into the lungs every 6 (six) hours as needed for wheezing or shortness of breath. 11/18/19   Wellington Hampshire, MD  amLODipine (NORVASC) 10 MG tablet Take 1 tablet (10 mg total) by mouth daily. 03/06/20   Wellington Hampshire, MD  amLODipine (NORVASC) 10 MG tablet Take by mouth. 08/01/20   [provider]  b complex vitamins tablet Take 1 tablet by mouth daily.    [provider]  Cholecalciferol (VITAMIN D-3) 5000 UNITS TABS Take 5,000 Units by mouth daily.     [provider]  clonazePAM (KLONOPIN) 1 MG tablet Take 1 mg by mouth 2 (two) times daily.    [provider]  clonazePAM (KLONOPIN) 1 MG tablet Take 1 tablet by mouth 2 (two) times daily as needed. 07/31/20   [provider]  clopidogrel  (PLAVIX) 75 MG tablet Take 1 tablet (75 mg total) by mouth daily with breakfast. 03/06/20   Wellington Hampshire, MD  fluticasone furoate-vilanterol (BREO ELLIPTA) 100-25 MCG/INH AEPB Inhale 1 puff into the lungs daily.    [provider]  HYDROcodone-acetaminophen (NORCO/VICODIN) 5-325 MG tablet Take 1 tablet by mouth every 6 (six) hours as needed for moderate pain.    [provider]  isosorbide mononitrate (IMDUR) 60 MG 24 hr tablet Take 1.5 tablets (90 mg total) by mouth daily. 10/24/14   Rise Mu, PA-C  JANUVIA 100 MG tablet Take 100 mg by mouth daily.  03/23/18   [provider]  levothyroxine (SYNTHROID) 100 MCG tablet Take by mouth. 06/13/20   [provider]  levothyroxine (SYNTHROID, LEVOTHROID) 100 MCG tablet Take 100 mcg by mouth daily before  breakfast.    [provider]  metoprolol succinate (TOPROL-XL) 50 MG 24 hr tablet Take 50 mg by mouth daily. Take with or immediately following a meal.    [provider]  ondansetron (ZOFRAN) 4 MG tablet Take 4 mg by mouth every 8 (eight) hours as needed for nausea or vomiting.    [provider]  sertraline (ZOLOFT) 100 MG tablet Take 100 mg by mouth 2 (two) times daily.     [provider]  sertraline (ZOLOFT) 100 MG tablet Take by mouth.    [provider]  spironolactone (ALDACTONE) 25 MG tablet Take 25 mg by mouth 2 (two) times daily.     [provider]    Physical Exam: Vitals:   10/31/22 1930 10/31/22 2000 10/31/22 2041 10/31/22 2044  BP: (!) 173/69 (!) 188/81    Pulse: 70 62    Resp: (!) 24 19    Temp:      TempSrc:      SpO2: 98% 98%    Weight:   76 kg 76 kg  Height:    5\' 6"  (1.676 m)   Physical Exam Vitals and nursing note reviewed.  Constitutional:      General: She is not in acute distress. HENT:     Head: Normocephalic and atraumatic.  Cardiovascular:     Rate and Rhythm: Normal rate and regular rhythm.     Heart sounds: Normal  heart sounds.  Pulmonary:     Effort: Pulmonary effort is normal.     Breath sounds: Normal breath sounds.  Abdominal:     Palpations: Abdomen is soft.     Tenderness: There is no abdominal tenderness.  Neurological:     Mental Status: Mental status is at baseline.     Comments: Left facial droop and dysarthria     Labs on Admission: I have personally reviewed following labs and imaging studies  CBC: Recent Labs  Lab 10/31/22 1920  WBC 7.0  NEUTROABS 4.6  HGB 13.5  HCT 42.3  MCV 98.6  PLT 0000000   Basic Metabolic Panel: Recent Labs  Lab 10/31/22 1920  NA 141  K 4.3  CL 106  CO2 27  GLUCOSE 135*  BUN 15  CREATININE 0.92  CALCIUM 8.9   GFR: Estimated Creatinine Clearance: 51.7 mL/min (by C-G formula based on SCr of 0.92 mg/dL). Liver Function Tests: Recent Labs  Lab 10/31/22 1920  AST 28  ALT 11  ALKPHOS 55  BILITOT 1.0  PROT 6.9  ALBUMIN 4.0   No results for input(s): "LIPASE", "AMYLASE" in the last 168 hours. No results for input(s): "AMMONIA" in the last 168 hours. Coagulation Profile: Recent Labs  Lab 10/31/22 1920  INR 1.0   Cardiac Enzymes: No results for input(s): "CKTOTAL", "CKMB", "CKMBINDEX", "TROPONINI" in the last 168 hours. BNP (last 3 results) No results for input(s): "PROBNP" in the last 8760 hours. HbA1C: No results for input(s): "HGBA1C" in the last 72 hours. CBG: Recent Labs  Lab 10/31/22 1909  GLUCAP 131*   Lipid Profile: No results for input(s): "CHOL", "HDL", "LDLCALC", "TRIG", "CHOLHDL", "LDLDIRECT" in the last 72 hours. Thyroid Function Tests: No results for input(s): "TSH", "T4TOTAL", "FREET4", "T3FREE", "THYROIDAB" in the last 72 hours. Anemia Panel: No results for input(s): "VITAMINB12", "FOLATE", "FERRITIN", "TIBC", "IRON", "RETICCTPCT" in the last 72 hours. Urine analysis:    Component Value Date/Time   COLORURINE COLORLESS (A) 10/31/2022 1932   APPEARANCEUR CLEAR (A) 10/31/2022 1932   APPEARANCEUR Clear  11/02/2013  1011   LABSPEC 1.004 (L) 10/31/2022 1932   LABSPEC 1.010 11/02/2013 1011   PHURINE 7.0 10/31/2022 1932   GLUCOSEU NEGATIVE 10/31/2022 1932   GLUCOSEU Negative 11/02/2013 1011   HGBUR NEGATIVE 10/31/2022 1932   BILIRUBINUR NEGATIVE 10/31/2022 1932   BILIRUBINUR Negative 11/02/2013 1011   KETONESUR NEGATIVE 10/31/2022 1932   PROTEINUR NEGATIVE 10/31/2022 1932   NITRITE NEGATIVE 10/31/2022 1932   LEUKOCYTESUR NEGATIVE 10/31/2022 1932   LEUKOCYTESUR Negative 11/02/2013 1011    Radiological Exams on Admission: CT HEAD CODE STROKE WO CONTRAST  Result Date: 10/31/2022 CLINICAL DATA:  Code stroke.  Neuro deficit, acute, stroke suspected EXAM: CT HEAD WITHOUT CONTRAST TECHNIQUE: Contiguous axial images were obtained from the base of the skull through the vertex without intravenous contrast. RADIATION DOSE REDUCTION: This exam was performed according to the departmental dose-optimization program which includes automated exposure control, adjustment of the mA and/or kV according to patient size and/or use of iterative reconstruction technique. COMPARISON:  CT head September 01, 2021. FINDINGS: Brain: Remote left PCA territory infarct. No evidence of acute large vascular territory infarct, mass lesion, midline shift or hydrocephalus. Vascular: No hyperdense vessel. Skull: No acute fracture. Sinuses/Orbits: Clear sinuses.  No acute orbital findings. ASPECTS [facility] Stroke Program Early CT Score) total score (0-10 with 10 being normal): 10. IMPRESSION: 1. No evidence of acute intracranial abnormality.  ASPECTS is 10. 2. Remote left PCA territory infarct. Code stroke imaging results were communicated on 10/31/2022 at 7:30 pm to provider Derrill Kay via telephone, who verbally acknowledged these results. Electronically Signed   By: Feliberto Harts M.D.   On: 10/31/2022 19:32     Data Reviewed: Relevant notes from primary care and specialist visits, past discharge summaries as available in EHR,  including Care Everywhere. Prior diagnostic testing as pertinent to current admission diagnoses Updated medications and problem lists for reconciliation ED course, including vitals, labs, imaging, treatment and response to treatment Triage notes, nursing and pharmacy notes and ED provider's notes Notable results as noted in HPI   Assessment and Plan: * Acute CVA (cerebrovascular accident) [facility]) Patient found to have small acute ischemic right MCA infarct in right frontal lobe.  Allow for permissive hypertension.  Neurology following.  Stroke workup in progress.  PT and OT recommending home health.  Passed swallow evaluation.  2D echo and A1c pending.  Fasting lipid panel notes LDL of 100, above target date.  Patient with previous history of hemorrhagic CVA and bilateral carotid artery stenosis.  Has been intolerant of statins in the past due to myalgias.  Will add Zetia.  MRI angiogram of neck notes no evidence of significant stenosis greater than 50% bilaterally.  Neurology recommending 90 days of aspirin and Plavix.  Questionable compliance with Plavix.  Patient also smokes  Essential hypertension Will hold home antihypertensives to allow for permissive hypertension given acute CVA.  BP on arrival was 188/81.  According to patient's son, blood pressure control has been an issue.  Will make adjustments accordingly  Chronic obstructive lung disease (HCC) Not acutely exacerbated Continue home Breo Ellipta Albuterol as needed  Hyperlipidemia Will add Zetia.  LDL not at range.  Intolerant of statins due to myalgias  CAD S/P percutaneous coronary angioplasty Remote RCA stent last cath 2010  Patient denies chest pain, EKG nonacute Continue aspirin,.  Holding metoprolol, spironolactone and amlodipine for permissive hypertension  Abdominal aortic aneurysm (HCC) Surveilled annually by Dr. Marena Chancy.  Last seen October 2022  Spinal stenosis in cervical region Pain control  Diabetes  (  [place]) Diet controlled, checking A1c Sliding scale insulin  Overweight (BMI 25.0-29.9) Meets criteria with BMI greater than 25  Tobacco abuse Ongoing according to son.  Will aggressively counseled.  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 Quinn Axe  Advance Care Planning:   Code Status: Prior   Family Communication: none  Disposition Plan: Back to previous home environment  Severity of Illness: The appropriate patient status for this patient is INPATIENT. Inpatient status is judged to be reasonable and necessary in order to provide the required intensity of service to ensure the patient's safety. The patient's presenting symptoms, physical exam findings, and initial radiographic and laboratory data in the context of their chronic comorbidities is felt to place them at high risk for further clinical deterioration. Furthermore, it is not anticipated that the patient will be medically stable for discharge from the hospital within 2 midnights of admission.   * I certify that at the point of admission it is my clinical judgment that the patient will require inpatient hospital care spanning beyond 2 midnights from the point of admission due to high intensity of service, high risk for further deterioration and high frequency of surveillance required.*  Author: Athena Masse, MD 10/31/2022 9:01 PM  For on call review [email or web address].

## 2022-10-31 NOTE — Assessment & Plan Note (Signed)
Diet controlled, checking A1c Sliding scale insulin

## 2022-10-31 NOTE — Assessment & Plan Note (Addendum)
Patient found to have small acute ischemic right MCA infarct in right frontal lobe.  Allow for permissive hypertension.  Neurology following.  Stroke workup in progress.  PT and OT recommending home health.  Passed swallow evaluation.  2D echo and A1c pending.  Fasting lipid panel notes LDL of 100, above target date.  Patient with previous history of hemorrhagic CVA and bilateral carotid artery stenosis.  Has been intolerant of statins in the past due to myalgias.  Will add Zetia.  MRI angiogram of neck notes no evidence of significant stenosis greater than 50% bilaterally.  Neurology recommending 90 days of aspirin and Plavix.  Questionable compliance with Plavix.  Patient also smokes

## 2022-10-31 NOTE — Assessment & Plan Note (Signed)
Pain control

## 2022-10-31 NOTE — Assessment & Plan Note (Signed)
Surveilled annually by Dr. Marena Chancy.  Last seen October 2022

## 2022-10-31 NOTE — Assessment & Plan Note (Signed)
As needed pain meds 

## 2022-10-31 NOTE — ED Notes (Signed)
Arrive to CT 1914. Pt airway clear in triage my PIT PA

## 2022-10-31 NOTE — Assessment & Plan Note (Signed)
Will hold home antihypertensives to allow for permissive hypertension given acute CVA.  BP on arrival was 188/81.  According to patient's son, blood pressure control has been an issue.  Will make adjustments accordingly

## 2022-10-31 NOTE — ED Notes (Signed)
Neuro completing NIH with this nurse.

## 2022-11-01 DIAGNOSIS — Z720 Tobacco use: Secondary | ICD-10-CM | POA: Diagnosis not present

## 2022-11-01 DIAGNOSIS — E785 Hyperlipidemia, unspecified: Secondary | ICD-10-CM

## 2022-11-01 DIAGNOSIS — E663 Overweight: Secondary | ICD-10-CM | POA: Diagnosis not present

## 2022-11-01 DIAGNOSIS — I639 Cerebral infarction, unspecified: Secondary | ICD-10-CM | POA: Diagnosis present

## 2022-11-01 DIAGNOSIS — I10 Essential (primary) hypertension: Secondary | ICD-10-CM

## 2022-11-01 LAB — CBG MONITORING, ED
Glucose-Capillary: 98 mg/dL (ref 70–99)
Glucose-Capillary: 105 mg/dL — ABNORMAL HIGH (ref 70–99)
Glucose-Capillary: 122 mg/dL — ABNORMAL HIGH (ref 70–99)
Glucose-Capillary: 78 mg/dL (ref 70–99)
Glucose-Capillary: 117 mg/dL — ABNORMAL HIGH (ref 70–99)

## 2022-11-01 LAB — LIPID PANEL
Cholesterol: 171 mg/dL (ref 0–200)
HDL: 49 mg/dL (ref >40)
LDL Cholesterol: 100 mg/dL — ABNORMAL HIGH (ref 0–99)
Total CHOL/HDL Ratio: 3.5 RATIO
Triglycerides: 111 mg/dL (ref <150)
VLDL: 22 mg/dL (ref 0–40)

## 2022-11-01 MED ORDER — HYDRALAZINE HCL 10 MG PO TABS
10.0000 mg | ORAL_TABLET | Freq: Three times a day (TID) | ORAL | Status: DC | PRN
  Administered 2022-11-01 (×2): 10 mg via ORAL
  Filled 2022-11-01 (×3): qty 1

## 2022-11-01 MED ORDER — HYDRALAZINE HCL 50 MG PO TABS
25.0000 mg | ORAL_TABLET | Freq: Four times a day (QID) | ORAL | Status: DC | PRN
  Administered 2022-11-02: 25 mg via ORAL
  Filled 2022-11-01: qty 1

## 2022-11-01 NOTE — Progress Notes (Signed)
[facility] Progress Note  Patient: Rhonda Park    [id number]  DOA: 10/31/2022    Date of Service: the patient was seen and examined on 11/01/2022  Brief hospital course: 81 year old female with past medical history of hemorrhagic CVA in 2017, CAD status post PCI in 2010, infrarenal AAA, hypertension, COPD and subcritical bilateral carotid artery stenosis with CTA 1 year ago noting 50% right ICA stenosis who presented to the emergency department on 12/22 with left-sided facial numbness, facial droop and dysarthria starting at 1640 p.m.  Patient presented as a code stroke.  Given previous history of hemorrhagic CVA, not felt to be tPA candidate.  MRI of head noted subtle small acute ischemic right MCA infarct in the subcortical right frontal lobe with MRA noting chronic left PCA occlusion.  Patient brought in for further evaluation and stroke workup.  Neurology following.  CVA looks to be secondary to small vessel disease rather than cardioembolic.  Plan is for aspirin and Plavix x 90 days.     Assessment and Plan: * Acute CVA (cerebrovascular accident) [facility]) Patient found to have small acute ischemic right MCA infarct in right frontal lobe.  Allow for permissive hypertension.  Neurology following.  Stroke workup in progress.  PT and OT recommending home health.  Passed swallow evaluation.  2D echo and A1c pending.  Fasting lipid panel notes LDL of 100, above target date.  Patient with previous history of hemorrhagic CVA and bilateral carotid artery stenosis.  Has been intolerant of statins in the past due to myalgias.  Will add Zetia.  MRI angiogram of neck notes no evidence of significant stenosis greater than 50% bilaterally.  Neurology recommending 90 days of aspirin and Plavix.  Questionable compliance with Plavix.  Patient also smokes  Essential hypertension Will hold home antihypertensives to allow for permissive hypertension given acute CVA.  BP on arrival was 188/81.  According  to patient's son, blood pressure control has been an issue.  Will make adjustments accordingly  Chronic obstructive lung disease ([place]) Not acutely exacerbated Continue home Breo Ellipta Albuterol as needed  Hyperlipidemia Will add Zetia.  LDL not at range.  Intolerant of statins due to myalgias  CAD S/P percutaneous coronary angioplasty Remote RCA stent last cath 2010  Patient denies chest pain, EKG nonacute Continue aspirin,.  Holding metoprolol, spironolactone and amlodipine for permissive hypertension  Abdominal aortic aneurysm (HCC) Surveilled annually by Dr. Franchot Gallo.  Last seen October 2022  Spinal stenosis in cervical region On surveillance by vascular, Dr. Franchot Gallo  Diabetes [facility]) Diet controlled, checking A1c Sliding scale insulin  Overweight (BMI 25.0-29.9) Meets criteria with BMI greater than 25  Tobacco abuse Ongoing according to son.  Will aggressively counseled.  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        Consultants: Neurology  Procedures: Echocardiogram pending  Antimicrobials: None  Code Status: Full code   Subjective: Complains of mild headache  Objective: Noted elevated blood pressures Vitals:   11/01/22 1846 11/01/22 2052  BP: (!) 205/73 (!) 153/65  Pulse: 66 (!) 57  Resp: 19 16  Temp: 98.3 F (36.8 C) 98.4 F (36.9 C)  SpO2: 96% 96%    Intake/Output Summary (Last 24 hours) at 11/01/2022 2056 Last data filed at 10/31/2022 2111 Gross per 24 hour  Intake --  Output 350 ml  Net -350 ml   Filed Weights   10/31/22 2041 10/31/22 2044  Weight: 76 kg 76 kg   Body mass index is 27.04 kg/m.  Exam:  General: Alert and oriented x 3, no acute distress HEENT: Normocephalic, atraumatic, mucous membranes are moist Cardiovascular: Regular rate and rhythm, S1-S2 Respiratory: Clear to auscultation Abdomen: Soft, nontender, nondistended,  positive bowel sounds Musculoskeletal: No clubbing or cyanosis or edema Skin: No skin breaks, tears or lesions Psychiatry: Appropriate, no evidence of psychoses Neurology: Generalized weakness, nonfocal.  Minimal facial droop.  Cranial nerves otherwise intact  Data Reviewed: LDL of 100.  A1c pending  Disposition:  Status is: Inpatient Remains inpatient appropriate because:  -Completion of stroke workup including echocardiogram and A1c -Setting up home health PT    Anticipated discharge date: 12/24  Family Communication: Updated son by phone DVT Prophylaxis: enoxaparin (LOVENOX) injection 40 mg Start: 10/31/22 2200    Author: Hollice Espy ,MD 11/01/2022 8:56 PM  To reach On-call, see care teams to locate the attending and reach out via [email or web address]. Between 7PM-7AM, please contact night-coverage If you still have difficulty reaching the attending provider, please page the [facility] (Director on Call) for [facility] on amion for assistance.

## 2022-11-01 NOTE — Assessment & Plan Note (Signed)
Will add Zetia.  LDL not at range.  Intolerant of statins due to myalgias

## 2022-11-01 NOTE — Hospital Course (Addendum)
81 year old female with past medical history of hemorrhagic CVA in 2017, CAD status post PCI in 2010, infrarenal AAA, hypertension, COPD and subcritical bilateral carotid artery stenosis with CTA 1 year ago noting 50% right ICA stenosis who presented to the emergency department on 12/22 with left-sided facial numbness, facial droop and dysarthria starting at 1640 p.m.  Patient presented as a code stroke.  Given previous history of hemorrhagic CVA, not felt to be tPA candidate.  MRI of head noted subtle small acute ischemic right MCA infarct in the subcortical right frontal lobe with MRA noting chronic left PCA occlusion.  Patient brought in for further evaluation and stroke workup.  Neurology following.  CVA looks to be secondary to small vessel disease rather than cardioembolic.  Plan is for aspirin and Plavix x 90 days.

## 2022-11-01 NOTE — ED Notes (Signed)
Report received from Leala, RN  

## 2022-11-01 NOTE — ED Notes (Signed)
Pt request scheduled medications for b/p 205/73.  Informed her that the MD ordered prn medication to manage medications.  Per MD "Holding metoprolol, spironolactone and amlodipine for permissive hypertension". This was explained to Family and pt several time today per this RN.

## 2022-11-01 NOTE — ED Notes (Signed)
Patient reports blurred vision for "several weeks now" patient reports numbness in lips, states numbness started in left cheek and lips but numbness has improved in left cheek but still in lips. Patient states numbness started on 10/30/2022

## 2022-11-01 NOTE — Assessment & Plan Note (Signed)
Meets criteria with BMI greater than 25 

## 2022-11-01 NOTE — Evaluation (Signed)
Physical Therapy Evaluation Patient Details Name: Rhonda Park MRN: [id number] DOB: 12-Oct-1941 Today's Date: 11/01/2022  History of Present Illness  Patient is a 81 year old female who presents to ED as a code stroke after acute neurologic deficits of L sided facial numbness, facial droop, and dysarthria. Pmh includes hemorrhagic CVA 2017, infrarenal AAA, CAD s/p PCI to RCA 2010, plavix, HTN, hyperaldosteronism on spironolactone, diet controlled DM, COPD, multiple back surgeries, subcritical bilateral carotid artery stenosis s/p CTA 08/2021 showed 07% R ICA and min LICA stenosis.   Clinical Impression  Patient is a pleasant 81 year old female who presents with generalized weakness and limited mobility. Prior to hospital admission, pt was mod independent and lives alone with her family helping with grocery shopping/errands.  Patient is in bed upon PT arrival and agreeable to participate in therapy. She transitions to EOB with additional time. She is able to doff her socks and don hospital socks without assistance sitting EOB. She then stands with RW and ambulates to toilet. She is able to self clean after toileting with no assistance or LOB. Vitals remained [facility] allowing for patient to ambulate in hallway with RW. Occasional cueing for task orientation required due to patient's verbose and tangential nature. Patient returned to bed with needs met.  Pt would benefit from skilled PT to address noted impairments and functional limitations (see below for any additional details).  Upon hospital discharge, pt would benefit from HHPT for increased safety with mobility and to reduce fall risk.        Recommendations for follow up therapy are one component of a multi-disciplinary discharge planning process, led by the attending physician.  Recommendations may be updated based on patient status, additional functional criteria and insurance authorization.  Follow Up Recommendations Home health PT       Assistance Recommended at Discharge Intermittent Supervision/Assistance  Patient can return home with the following  Assistance with cooking/housework;A little help with bathing/dressing/bathroom;Assist for transportation;Direct supervision/assist for medications management;Help with stairs or ramp for entrance    Equipment Recommendations BSC/3in1  Recommendations for Other Services       Functional Status Assessment Patient has had a recent decline in their functional status and demonstrates the ability to make significant improvements in function in a reasonable and predictable amount of time.     Precautions / Restrictions Precautions Precautions: Fall Restrictions Weight Bearing Restrictions: No      Mobility  Bed Mobility Overal bed mobility: Modified Independent             General bed mobility comments: Requires extra time to get to edge of bed    Transfers Overall transfer level: Needs assistance Equipment used: Rolling walker (2 wheels) Transfers: Sit to/from Stand Sit to Stand: Min guard, Supervision           General transfer comment: Supervision/CGA, monitoring vitals within functional range.    Ambulation/Gait Ambulation/Gait assistance: Modified independent (Device/Increase time) Gait Distance (Feet): 130 Feet Assistive device: Rolling walker (2 wheels) Gait Pattern/deviations: Step-through pattern, Narrow base of support, Decreased stride length Gait velocity: decreased     General Gait Details: ambulates with safe body mechanics, vitals monitored, WFL. Fatigues with prolonged ambulation  Stairs            Wheelchair Mobility    Modified Rankin (Stroke Patients Only)       Balance Overall balance assessment: Needs assistance Sitting-balance support: No upper extremity supported Sitting balance-Leahy Scale: Fair Sitting balance - Comments: able to don socks  independently   Standing balance support: Bilateral upper extremity  supported, During functional activity Standing balance-Leahy Scale: Fair Standing balance comment: able to static stand to self clean s/p toileting without LOB without UE support                             Pertinent Vitals/Pain Pain Assessment Pain Assessment: 0-10 Pain Score: 6  Pain Location: back Pain Descriptors / Indicators: Aching Pain Intervention(s): Monitored during session, Repositioned    Home Living Family/patient expects to be discharged to:: Private residence Living Arrangements: Alone Available Help at Discharge: Family (Has children/grandchildren who are nearby to help) Type of Home: House Home Access: Stairs to enter Entrance Stairs-Rails: Psychiatric nurse of Steps: 4 in back, 7 in front   Home Layout: One level Home Equipment: [place] (2 wheels) Additional Comments: Patient reports she has a walker for her back pain. Has not been using her shower due to issues getting in/out    Prior Function Prior Level of Function : Independent/Modified Independent;History of Falls (last six months)             Mobility Comments: Utilized walker when her back pain increased. ADLs Comments: Mod I ADLs (increased time). Limited use of shower due to fear of getting in/out of shower. Family provides transportation (delivers groceries and medications), IND med mgmt/cleaning/cooking     Hand Dominance        Extremity/Trunk Assessment   Upper Extremity Assessment Upper Extremity Assessment: Defer to OT evaluation    Lower Extremity Assessment Lower Extremity Assessment: Generalized weakness (Grossly 4/5 bilaterally. Does require additonal cueing for testing limiting full test)       Communication   Communication: Expressive difficulties  Cognition Arousal/Alertness: Awake/alert Behavior During Therapy: WFL for tasks assessed/performed Overall Cognitive Status: No family/caregiver present to determine baseline cognitive  functioning                                 General Comments: Patient oriented to self, location, and date. Is verbose and tangential        General Comments General comments (skin integrity, edema, etc.): Patient appears well nourished and groomed.    Exercises Other Exercises Other Exercises: Patient educated on safe mobility and transfers, fall prevention, use of RW, role of PT in acute care setting   Assessment/Plan    PT Assessment Patient needs continued PT services  PT Problem List Decreased strength;Decreased activity tolerance;Decreased mobility;Cardiopulmonary status limiting activity;Pain       PT Treatment Interventions DME instruction;Gait training;Stair training;Therapeutic exercise;Therapeutic activities;Functional mobility training;Balance training;Neuromuscular re-education;Manual techniques;Patient/family education    PT Goals (Current goals can be found in the Care Plan section)  Acute Rehab PT Goals Patient Stated Goal: to return home PT Goal Formulation: With patient Time For Goal Achievement: 11/15/22 Potential to Achieve Goals: Fair    Frequency Min 2X/week     Co-evaluation               AM-PAC PT "6 Clicks" Mobility  Outcome Measure Help needed turning from your back to your side while in a flat bed without using bedrails?: None Help needed moving from lying on your back to sitting on the side of a flat bed without using bedrails?: A Little Help needed moving to and from a bed to a chair (including a wheelchair)?: A Little Help needed standing up from a  chair using your arms (e.g., wheelchair or bedside chair)?: A Little Help needed to walk in hospital room?: A Little Help needed climbing 3-5 steps with a railing? : A Lot 6 Click Score: 18    End of Session Equipment Utilized During Treatment: Gait belt Activity Tolerance: Patient tolerated treatment well Patient left: in bed;with call bell/phone within reach Nurse  Communication: Mobility status PT Visit Diagnosis: Unsteadiness on feet (R26.81);Other abnormalities of gait and mobility (R26.89);Muscle weakness (generalized) (M62.81);Pain Pain - Right/Left:  (lower, mid) Pain - part of body:  (back)    Time: 0539-7673 PT Time Calculation (min) (ACUTE ONLY): 29 min   Charges:   PT Evaluation $PT Eval Moderate Complexity: 1 Mod PT Treatments $Therapeutic Activity: 8-22 mins        Janna Arch, PT, DPT  11/01/2022, 12:35 PM

## 2022-11-01 NOTE — Assessment & Plan Note (Signed)
Ongoing according to son.  Will aggressively counseled.

## 2022-11-01 NOTE — Evaluation (Signed)
Occupational Therapy Evaluation Patient Details Name: Rhonda Park MRN: [id number] DOB: 1941-06-07 Today's Date: 11/01/2022   History of Present Illness Patient is a 81 year old female who presents to ED as a code stroke after acute neurologic deficits of L sided facial numbness, facial droop, and dysarthria. Pmh includes hemorrhagic CVA 2017, infrarenal AAA, CAD s/p PCI to RCA 2010, plavix, HTN, hyperaldosteronism on spironolactone, diet controlled DM, COPD, multiple back surgeries, subcritical bilateral carotid artery stenosis s/p CTA 08/2021 showed [id number] R ICA and min LICA stenosis.   Clinical Impression   Patient presenting with decreased independence in self care, balance, functional mobility/transfers, and endurance. Prior to admission, pt lived alone and was Mod I for ADLs/IADLs (sponge bathes, family provides transportation). Pt currently functioning at Mod I for bed mobility, Min guard to stand from EOB, and Min guard to take lateral steps toward [facility] using RW. She completed seated LB dressing with set up A. Anticipate pt to complete sinkside grooming tasks with set up-supervision and VC for environmental scanning to locate ADL items 2/2 low vision. Pt will benefit from acute OT to increase overall independence in the areas of ADLs and functional mobility in order to safely discharge home. Pt could benefit from [facility] following D/C to decrease falls risk, improve balance, and maximize independence in self-care within own home environment.     Recommendations for follow up therapy are one component of a multi-disciplinary discharge planning process, led by the attending physician.  Recommendations may be updated based on patient status, additional functional criteria and insurance authorization.   Follow Up Recommendations  Home health OT     Assistance Recommended at Discharge Intermittent Supervision/Assistance  Patient can return home with the following A little help with walking and/or  transfers;A little help with bathing/dressing/bathroom;Assistance with cooking/housework;Assist for transportation;Help with stairs or ramp for entrance;Direct supervision/assist for financial management;Direct supervision/assist for medications management    Functional Status Assessment  Patient has had a recent decline in their functional status and demonstrates the ability to make significant improvements in function in a reasonable and predictable amount of time.  Equipment Recommendations  BSC/3in1;Tub/shower bench    Recommendations for Other Services       Precautions / Restrictions Precautions Precautions: Fall Restrictions Weight Bearing Restrictions: No      Mobility Bed Mobility Overal bed mobility: Modified Independent             General bed mobility comments: supine<>sit, Requires extra time to get to edge of bed    Transfers Overall transfer level: Needs assistance Equipment used: Rolling walker (2 wheels) Transfers: Sit to/from Stand Sit to Stand: Min guard           General transfer comment: STS from EOB      Balance Overall balance assessment: Needs assistance Sitting-balance support: No upper extremity supported, Feet supported Sitting balance-Leahy Scale: Fair     Standing balance support: Bilateral upper extremity supported, During functional activity Standing balance-Leahy Scale: Fair                             ADL either performed or assessed with clinical judgement   ADL Overall ADL's : Needs assistance/impaired     Grooming: Set up;Supervision/safety;Standing               Lower Body Dressing: Set up;Supervision/safety;Sitting/lateral leans Lower Body Dressing Details (indicate cue type and reason): able to demonstrate figure 4 technique Toilet Transfer: Rolling walker (  2 wheels);Min guard Toilet Transfer Details (indicate cue type and reason): simulated with STS from EOB         Functional mobility during  ADLs: Min guard;Rolling walker (2 wheels) (to take ~4 lateral steps toward [facility])       Vision Baseline Vision/History: 2 Legally blind Ability to See in Adequate Light: 1 Impaired Additional Comments: 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 Pt endorsed vision being "fuzzy" at times for the past month, does not see eye doctor anymore     Perception     Praxis      Pertinent Vitals/Pain Pain Assessment Pain Assessment: 0-10 Pain Score: 6  Pain Location: back Pain Descriptors / Indicators: Aching Pain Intervention(s): Monitored during session, Repositioned     Hand Dominance     Extremity/Trunk Assessment Upper Extremity Assessment Upper Extremity Assessment: Generalized weakness   Lower Extremity Assessment Lower Extremity Assessment: Generalized weakness       Communication Communication Communication: Expressive difficulties   Cognition Arousal/Alertness: Awake/alert Behavior During Therapy: WFL for tasks assessed/performed Overall Cognitive Status: No family/caregiver present to determine baseline cognitive functioning                                 General Comments: Patient oriented to self, location, and date. Is verbose and tangential requiring VC for redirection     General Comments  BP in supine 190/65 (MAP 102), after activity 195/66 (MAP 103). No s/s. RN present and aware (going to message MD about BP medication).    Exercises Other Exercises Other Exercises: OT provided education re: role of OT, OT POC, post acute recs, sitting up for all meals, EOB/OOB mobility with assistance, home/fall safety.     Shoulder Instructions      Home Living Family/patient expects to be discharged to:: Private residence Living Arrangements: Alone Available Help at Discharge: Family (Has children/grandchildren who are nearby  to help) Type of Home: House Home Access: Stairs to enter [employer] of Steps: 4 in back, 7 in front Entrance Stairs-Rails: Right;Left Home Layout: One level     Bathroom Shower/Tub: Tub/shower unit;Sponge bathes at baseline   [employer]: Standard     Home Equipment: Conservation officer, nature (2 wheels)   Additional Comments: Patient reports she has a walker for her back pain. Has not been using her shower due to issues getting in/out      Prior Functioning/Environment Prior Level of Function : Independent/Modified Independent;History of Falls (last six months)             Mobility Comments: Utilized walker when her back pain increased. ADLs Comments: Mod I ADLs (increased time). Limited use of shower due to fear of getting in/out of shower. Family provides transportation (delivers groceries and medications), IND med mgmt/cleaning/cooking        OT Problem List: Pain;Decreased strength;Decreased activity tolerance;Impaired balance (sitting and/or standing);Cardiopulmonary status limiting activity;Impaired vision/perception      OT Treatment/Interventions: Self-care/ADL training;Therapeutic exercise;Therapeutic activities;Energy conservation;DME and/or AE instruction;Patient/family education;Balance training;Visual/perceptual remediation/compensation    OT Goals(Current goals can be found in the care plan section) Acute Rehab OT Goals Patient Stated Goal: return home OT Goal Formulation: With patient Time For Goal Achievement: 11/15/22 Potential to Achieve Goals: Fair   OT Frequency: Min 2X/week    Co-evaluation  AM-PAC OT "6 Clicks" Daily Activity     Outcome Measure Help from another person eating meals?: None Help from another person taking care of personal grooming?: A Little Help from another person toileting, which includes using toliet, bedpan, or urinal?: A Little Help from another person bathing (including washing, rinsing, drying)?:  A Little Help from another person to put on and taking off regular upper body clothing?: None Help from another person to put on and taking off regular lower body clothing?: A Little 6 Click Score: 20   End of Session Equipment Utilized During Treatment: Gait belt;Rolling walker (2 wheels) Nurse Communication: Mobility status;Other (comment) (elevated BP)  Activity Tolerance: Patient tolerated treatment well;Treatment limited secondary to medical complications (Comment) (elevated BP) Patient left: in bed  OT Visit Diagnosis: Unsteadiness on feet (R26.81);Muscle weakness (generalized) (M62.81);Other symptoms and signs involving the nervous system (R29.898);Pain;Low vision, both eyes (H54.2) Pain - Right/Left:  (lower, mid) Pain - part of body:  (back)                Time: 3299-2426 OT Time Calculation (min): 21 min Charges:  OT General Charges $OT Visit: 1 Visit OT Evaluation $OT Eval Moderate Complexity: 1 Mod  [employer] MS, OTR/L ascom [phone]  11/01/22, 12:47 PM

## 2022-11-01 NOTE — ED Notes (Signed)
BS 117

## 2022-11-02 ENCOUNTER — Inpatient Hospital Stay (HOSPITAL_COMMUNITY)
Admit: 2022-11-02 | Discharge: 2022-11-02 | Disposition: A | Discharge status: Home / Self Care | Payer: Medicare Other | Attending: Neurology | Admitting: Neurology

## 2022-11-02 DIAGNOSIS — I6389 Other cerebral infarction: Secondary | ICD-10-CM

## 2022-11-02 DIAGNOSIS — E785 Hyperlipidemia, unspecified: Secondary | ICD-10-CM | POA: Diagnosis not present

## 2022-11-02 DIAGNOSIS — E663 Overweight: Secondary | ICD-10-CM | POA: Diagnosis not present

## 2022-11-02 DIAGNOSIS — Z720 Tobacco use: Secondary | ICD-10-CM | POA: Diagnosis not present

## 2022-11-02 DIAGNOSIS — I639 Cerebral infarction, unspecified: Secondary | ICD-10-CM | POA: Diagnosis not present

## 2022-11-02 LAB — GLUCOSE, CAPILLARY
Glucose-Capillary: 104 mg/dL — ABNORMAL HIGH (ref 70–99)
Glucose-Capillary: 108 mg/dL — ABNORMAL HIGH (ref 70–99)
Glucose-Capillary: 121 mg/dL — ABNORMAL HIGH (ref 70–99)

## 2022-11-02 LAB — ECHOCARDIOGRAM COMPLETE
AR max vel: 2.78 cm2
AV Area VTI: 3.08 cm2
AV Area mean vel: 2.73 cm2
AV Mean grad: 3 mmHg
AV Peak grad: 4.9 mmHg
Ao pk vel: 1.11 m/s
Area-P 1/2: 4.65 cm2
Height: 65 in
P 1/2 time: 609 msec
S' Lateral: 2.8 cm
Weight: 2532.8 oz

## 2022-11-02 MED ORDER — ASPIRIN 81 MG PO CHEW: 81.0000 mg | CHEWABLE_TABLET | Freq: Every day | ORAL | 0 refills | Status: DC

## 2022-11-02 MED ORDER — ORAL CARE MOUTH RINSE: 15.0000 mL | OROMUCOSAL | Status: DC | PRN

## 2022-11-02 MED ORDER — EZETIMIBE 10 MG PO TABS
10.0000 mg | ORAL_TABLET | Freq: Every day | ORAL | Status: DC
  Filled 2022-11-02: qty 1

## 2022-11-02 MED ORDER — METOPROLOL SUCCINATE ER 25 MG PO TB24: 50.0000 mg | ORAL_TABLET | Freq: Every day | ORAL | 4 refills | Status: DC

## 2022-11-02 MED ORDER — NICOTINE 14 MG/24HR TD PT24: 14.0000 mg | MEDICATED_PATCH | TRANSDERMAL | 0 refills | Status: DC

## 2022-11-02 MED ORDER — HYDRALAZINE HCL 50 MG PO TABS: 50.0000 mg | ORAL_TABLET | Freq: Three times a day (TID) | ORAL | 3 refills | Status: DC

## 2022-11-02 MED ORDER — SPIRONOLACTONE 25 MG PO TABS: 25.0000 mg | ORAL_TABLET | Freq: Every day | ORAL | Status: DC

## 2022-11-02 MED ORDER — INFLUENZA VAC A&B SA ADJ QUAD 0.5 ML IM PRSY: 0.5000 mL | PREFILLED_SYRINGE | INTRAMUSCULAR | Status: DC

## 2022-11-02 MED ORDER — CLOPIDOGREL BISULFATE 75 MG PO TABS: 75.0000 mg | ORAL_TABLET | Freq: Every day | ORAL | 3 refills | Status: DC

## 2022-11-02 NOTE — Evaluation (Signed)
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 10/31/2022   Chronic obstructive lung disease (HCC) 10/31/2022   Aneurysm of thoracic aorta (HCC) 09/03/2022   Atherosclerosis of native arteries of extremity with intermittent claudication (HCC) 09/03/2022   Carotid stenosis 09/05/2021   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   S/P lumbar laminectomy 12/01/2015   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   Displacement of lumbar intervertebral disc without myelopathy 10/25/2015   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   Diabetes (HCC) 07/03/2014   Difficulty in walking 07/03/2014   Falls 07/03/2014   History of stroke 07/03/2014   Memory loss, short term 07/03/2014   Visual disturbance as complication of stroke 07/03/2014   Spinal stenosis in cervical region 05/23/2014   Stenosis of cervical spine with myelopathy (HCC) 05/23/2014   Abdominal aortic aneurysm (HCC) 10/27/2013   CAD S/P percutaneous coronary angioplasty    Essential hypertension    Hyperlipidemia    Past Medical History:  Past Medical History:  Diagnosis Date   Anemia    Anxiety    Arthritis    Cardiomegaly    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 Remote RCA stent;  b. Most recent cardiac  catheterization in May of 2010 showed an EF of 65% with 50% ostial left main stenosis and 90% distal LAD stenosis close to the apex;  c. 11/2015 Low risk MV w/ subtle antlat ischemia; d. 08/2017 MV: No ischemia/infarct. Low Risk. EF 55-65%.   Diastolic dysfunction    a. 11/2015 Echo: EF 60-65%, Gr1 DD, mild AI, midly dil LA.   DOE (dyspnea on exertion)    a. With wheezing - does not like to use inhalers.   Essential hypertension    a. With evidence of hyperaldosteronism with severe hypokalemia. 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   GERD (gastroesophageal reflux disease)    Headache(784.0)    History of blood transfusion    30 units child birth   Hyperlipidemia    Hypothyroidism    Type II or unspecified type diabetes mellitus without mention of complication, not stated as uncontrolled    Not on medications   UC (ulcerative colitis) (HCC)    Unspecified congenital cystic kidney disease    Past Surgical History:  Past Surgical History:  Procedure Laterality Date   ABDOMINAL HYSTERECTOMY     ANTERIOR CERVICAL DECOMP/DISCECTOMY FUSION N/A 05/23/2014   Procedure: CERVICAL FIVE SIX ANTERIOR CERVICAL DECOMPRESSION/DISCECTOMY FUSION 1 LEVEL;  Surgeon: Temple Pacini, MD;  Location: [facility];  Service: Neurosurgery;  Laterality: N/A;   APPENDECTOMY     BACK SURGERY     BREAST CYST EXCISION Left    3   CARDIAC CATHETERIZATION  03/2009   [facility]   CARDIAC CATHETERIZATION  05/2007   [facility]   CARDIAC CATHETERIZATION  05/2009   ''  CARDIAC CATHETERIZATION  07/2005   ''   CHOLECYSTECTOMY     hysterectomy     KNEE SURGERY Right    "growth"   LUMBAR LAMINECTOMY/DECOMPRESSION MICRODISCECTOMY Right 11/30/2015   Procedure: LUMBAR LAMINECTOMY/DECOMPRESSION MICRODISCECTOMY 1 LEVEL;  Surgeon: Earnie Larsson, MD;  Location: [facility];  Service: Neurosurgery;  Laterality: Right;   posterior segment     unlisted procedure   TONSILLECTOMY     HPI:  Per H&P "Rhonda Park is a 81 y.o. 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  She was previously in her usual state of health except for next weakness on awakening on that day.  NIH 3 on arrival  ED course and data review: On arrival BP 173/69 with otherwise normal vitals  Head CT code stroke protocol called showed no evidence of acute intracranial abnormality  Patient was seen in consultation by neurology, Dr. Quinn Axe who recommended no tPA given history of hemorrhagic stroke.  CTA not performed secondary to neurologic exam not being consistent with LVO.  Stroke recommendations given as will be outlined in assessment and plan.  Additional data reviewed included blood work which included CBC, CMP, urinalysis and UDS all of which were unremarkable  Patient was given aspirin suppository.  Hospitalist consulted for admission."   Assessment / Plan / Recommendation Clinical Impression  Pt seen for cognitive-linguistic evaluation. Pt alert, pleasant, and cooperative. Endorsed a near complete resolution of speech/language difficulty.  Assessment completed via informal means and portions of Cognistat. Pt presents with cognitive-linguistic deficits affecting attention, short term memory, functional problem solving, and reasoning. Pt's speech is c/b occasional paucity of speech due to word retrieval deficits with emerging ability to repair communication breakdowns; Additionally, speech is tangential and pt hyperverbal requiring frequent redirection.  German-influenced speech appreciated; however, no s/sx dysarthria noted.  Based on today's assessment, anticipate need for frequent/constant redirection and skilled SLP services at d/c to address cognitive-linguistic functioning. SLP to f/u per POC while pt in house.   Pt educated re: results, recommendations, and SLP POC. Pt verbalized understanding/agreement. Reinforcement of content may be needed.    SLP Assessment  SLP Recommendation/Assessment: Patient needs continued Speech [place] Pathology Services SLP Visit Diagnosis: Cognitive communication deficit (R41.841)    Recommendations for follow up therapy are one component of a multi-disciplinary discharge planning process, led by the attending physician.  Recommendations may be updated based on patient status, additional functional criteria and insurance authorization.    Follow Up Recommendations  Home health SLP    Assistance Recommended at Discharge  Frequent or constant Supervision/Assistance  Functional Status Assessment Patient has had a recent decline in their functional status and demonstrates the ability to make significant improvements in function in a reasonable and predictable amount of time.  Frequency and Duration min 2x/week  2 weeks      SLP Evaluation Cognition  Overall Cognitive Status: No family/caregiver present to determine baseline cognitive functioning Arousal/Alertness: Awake/alert Orientation Level: Oriented to person;Oriented to place;Disoriented to time Year:  (1993; cue needed to correct) Memory: Impaired Memory Impairment: Storage deficit;Retrieval  deficit Awareness: Impaired Problem Solving: Impaired Problem Solving Impairment: Functional basic Safety/Judgment: Impaired       Comprehension  Auditory Comprehension Overall Auditory Comprehension: Appears within functional limits for tasks assessed Yes/No Questions: Within Functional Limits Commands: Within Functional Limits    Expression  Expression Primary Mode of Expression: Verbal Verbal Expression Overall Verbal Expression: Impaired Initiation: No impairment Automatic Speech: Social Response (WFL) Level of Generative/Spontaneous Verbalization: Conversation Repetition: No impairment Naming: No impairment Pragmatics: Impairment Impairments: Topic maintenance;Topic appropriateness;Turn Taking Interfering Components: Attention Other Verbal Expression Comments: occasional paucity of speech due to word retrieval deficits with emerging ability to repair communication breakdowns; speech is tangential; hyperverbal   Oral / Motor  Oral Motor/Sensory Function Overall Oral Motor/Sensory Function: Within functional limits Motor Speech Overall Motor Speech: Appears within functional limits for tasks assessed Respiration: Within functional limits Phonation: Normal Resonance: Within functional limits Articulation: Within functional limitis Intelligibility: Intelligible Motor Planning: Witnin functional limits Motor Speech Errors: Not applicable Interfering Components:  (German-influenced English)            Rhonda Park 11/02/2022, 11:10 AM

## 2022-11-02 NOTE — Discharge Summary (Signed)
Physician Discharge Summary   Patient: Rhonda Park MRN: [id number] DOB: 1941/09/29  Admit date:     10/31/2022  Discharge date: 11/02/22  Discharge Physician: Annita Brod   PCP: Remi Haggard, FNP   Recommendations at discharge:   New medication: Aspirin 81 mg p.o. daily x 90 days New medication: Plavix 75 mg p.o. daily.  Patient previously been on Plavix, but had run out of refills not been able to see her primary care physician.  Medication restarted with new prescriptions New medication: Hydralazine 50 mg p.o. 3 times daily Medication change: Metoprolol 50 mg XR decreased to 25 mg XR daily New medication: Nicotine patch 14 mg topically daily Patient set up with home health PT OT and speech therapy  Discharge Diagnoses: Principal Problem:   Acute CVA (cerebrovascular accident) [facility]) Active Problems:   History of hemorrhagic cerebrovascular accident (CVA) with residual deficit 2017   Bilateral carotid artery stenosis   Essential hypertension   Chronic obstructive lung disease ([place])   Hyperlipidemia   CAD S/P percutaneous coronary angioplasty   Abdominal aortic aneurysm ([place])   Spinal stenosis in cervical region   Diabetes ([place])   Overweight (BMI 25.0-29.9)   Tobacco abuse   CVA (cerebral vascular accident) ([place])  Resolved Problems:   * No resolved hospital problems. *  Hospital Course: 81 year old female with past medical history of hemorrhagic CVA in 2017, CAD status post PCI in 2010, infrarenal AAA, hypertension, COPD and subcritical bilateral carotid artery stenosis with CTA 1 year ago noting 50% right ICA stenosis who presented to the emergency department on 12/22 with left-sided facial numbness, facial droop and dysarthria starting at 1640 p.m.  Patient presented as a code stroke.  Given previous history of hemorrhagic CVA, not felt to be tPA candidate.  MRI of head noted subtle small acute ischemic right MCA infarct in the subcortical right frontal lobe  with MRA noting chronic left PCA occlusion.  Patient brought in for further evaluation and stroke workup.  Neurology following.  CVA looks to be secondary to small vessel disease rather than cardioembolic.  Plan is for aspirin and Plavix x 90 days.    Assessment and Plan: * Acute CVA (cerebrovascular accident) [facility]) Patient found to have small acute ischemic right MCA infarct in right frontal lobe.  Allow for permissive hypertension.  Neurology following.  Stroke workup in progress.  PT and OT recommending home health.  Passed swallow evaluation, but needs home health follow-up.  2D echo unremarkable.  A1c pending.  Fasting lipid panel notes LDL of 100, above target date.  Patient with previous history of hemorrhagic CVA and bilateral carotid artery stenosis.  Has been intolerant of statins and Zetia in the past due to myalgias.  MRI angiogram of neck notes no evidence of significant stenosis greater than 50% bilaterally.  Neurology recommending 90 days of aspirin and Plavix.  Questionable compliance with Plavix.  Patient also smokes  Essential hypertension According to patient and her son, her blood pressure has been difficult to control.  She takes Aldactone 25 daily, Imdur 60 and metoprolol XR 50.  Patient noted to have persistent bradycardia even though she has not been on metoprolol now for several days.  At 1 point, she was also on Norvasc, but had to stop this due to allergy.  Will plan to discharge on Imdur, Aldactone and decrease metoprolol XR to 25.  Will also add hydralazine 50 3 times daily.  Chronic obstructive lung disease ([place]) Not acutely exacerbated Continue home  Breo Ellipta Albuterol as needed  Hyperlipidemia LDL not at range.  Intolerant of statins and Zetia due to myalgias  CAD S/P percutaneous coronary angioplasty Remote RCA stent last cath 2010  Patient denies chest pain, EKG nonacute Started on aspirin and resumed on Plavix  Abdominal aortic aneurysm ([place]) Surveilled  annually by Dr. Franchot Gallo.  Last seen October 2022  Spinal stenosis in cervical region Pain control  Diabetes ([place]) Diet controlled, checking A1c Sliding scale insulin  Overweight (BMI 25.0-29.9) Meets criteria with BMI greater than 25  Tobacco abuse Advised patient to quit.  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 As needed pain meds         Consultants: Neurology Procedures performed: Echocardiogram noting grade 1 diastolic dysfunction Disposition: Home with home health Diet recommendation:  Discharge Diet Orders (From admission, onward)     Start     Ordered   11/02/22 0000  Diet - low sodium heart healthy        11/02/22 1356           Heart healthy DISCHARGE MEDICATION: Allergies as of 11/02/2022       Reactions   Breo Ellipta [fluticasone Furoate-vilanterol] Palpitations   Demerol [meperidine] Other (See Comments)   Other Reaction: CNS Disorder   Codeine    Reports that she gets jittery and does not feel right able to tolerate hydrocodone   Ivp Dye [iodinated Contrast Media] Hives   Shellfish Allergy Hives   Statins Other (See Comments)   myalgia myalgia        Medication List     STOP taking these medications    amLODipine 10 MG tablet Commonly known as: NORVASC   fluticasone furoate-vilanterol 100-25 MCG/INH Aepb Commonly known as: BREO ELLIPTA   HYDROcodone-acetaminophen 5-325 MG tablet Commonly known as: NORCO/VICODIN       TAKE these medications    albuterol 108 (90 Base) MCG/ACT inhaler Commonly known as: VENTOLIN HFA Inhale 1-2 puffs into the lungs every 6 (six) hours as needed for wheezing or shortness of breath.   aspirin 81 MG chewable tablet Chew 1 tablet (81 mg total) by mouth daily. Start taking on: November 03, 2022   b complex vitamins tablet Take 1 tablet by mouth daily.   clonazePAM 1 MG tablet Commonly known as: KLONOPIN Take 1 mg by mouth 2  (two) times daily.   clopidogrel 75 MG tablet Commonly known as: PLAVIX Take 1 tablet (75 mg total) by mouth daily with breakfast.   hydrALAZINE 50 MG tablet Commonly known as: APRESOLINE Take 1 tablet (50 mg total) by mouth 3 (three) times daily.   isosorbide mononitrate 60 MG 24 hr tablet Commonly known as: IMDUR Take 1.5 tablets (90 mg total) by mouth daily.   Januvia 100 MG tablet Generic drug: sitaGLIPtin Take 100 mg by mouth daily.   levothyroxine 100 MCG tablet Commonly known as: SYNTHROID Take 100 mcg by mouth daily before breakfast.   metoprolol succinate 25 MG 24 hr tablet Commonly known as: TOPROL-XL Take 2 tablets (50 mg total) by mouth daily. Take with or immediately following a meal. What changed: medication strength   nicotine 14 mg/24hr patch Commonly known as: NICODERM CQ - dosed in mg/24 hours Place 1 patch (14 mg total) onto the skin daily.   sertraline 100 MG tablet Commonly known as: ZOLOFT Take 100 mg by mouth daily.   spironolactone 25 MG tablet Commonly known as: ALDACTONE Take 1 tablet (25 mg  total) by mouth daily. What changed: when to take this   Vitamin D-3 125 MCG (5000 UT) Tabs Take 5,000 Units by mouth daily.        Follow-up Information     Remi Haggard, FNP. Schedule an appointment as soon as possible for a visit in 1 month(s).   Specialty: Family Medicine Contact information: [place] [address] [phone]                Discharge Exam: Danley Danker Weights   10/31/22 2041 10/31/22 2044 11/02/22 0007  Weight: 76 kg 76 kg 71.8 kg   General: Alert and oriented x 3, no acute distress Cardiovascular: Regular rate and rhythm, S1-S2 Lungs: Clear to auscultation bilaterally  Condition at discharge: good  The results of significant diagnostics from this hospitalization (including imaging, microbiology, ancillary and laboratory) are listed below for reference.   Imaging Studies: ECHOCARDIOGRAM  COMPLETE  Result Date: 11/02/2022    ECHOCARDIOGRAM REPORT   Patient Name:   Rhonda Park Date of Exam: 11/02/2022 Medical Rec #:  [id number]       Height:       65.0 in Accession #:    [id number]      Weight:       158.3 lb Date of Birth:  10/27/1941       BSA:          1.791 m Patient Age:    89 years        BP:           190/67 mmHg Patient Gender: F               HR:           68 bpm. Exam Location:  [facility] Procedure: 2D Echo, Color Doppler and Cardiac Doppler Indications:     I63.9 Stroke  History:         Patient has prior history of Echocardiogram examinations, most                  recent 11/14/2015. CAD; Risk Factors:Hypertension, Dyslipidemia                  and Diabetes.  Sonographer:     Charmayne Sheer Referring Phys:  [place] Diagnosing Phys: Kate Sable MD  Sonographer Comments: Suboptimal parasternal window. IMPRESSIONS  1. Left ventricular ejection fraction, by estimation, is 55 to 60%. The left ventricle has normal function. The left ventricle has no regional wall motion abnormalities. There is mild left ventricular hypertrophy. Left ventricular diastolic parameters are consistent with Grade I diastolic dysfunction (impaired relaxation).  2. Right ventricular systolic function is normal. The right ventricular size is normal. Mildly increased right ventricular wall thickness.  3. The mitral valve is normal in structure. No evidence of mitral valve regurgitation.  4. The aortic valve was not well visualized. Aortic valve regurgitation is mild to moderate. Aortic valve sclerosis/calcification is present, without any evidence of aortic stenosis. FINDINGS  Left Ventricle: Left ventricular ejection fraction, by estimation, is 55 to 60%. The left ventricle has normal function. The left ventricle has no regional wall motion abnormalities. The left ventricular internal cavity size was normal in size. There is  mild left ventricular hypertrophy. Left ventricular diastolic parameters are  consistent with Grade I diastolic dysfunction (impaired relaxation). Right Ventricle: The right ventricular size is normal. Mildly increased right ventricular wall thickness. Right ventricular systolic function is normal. Left Atrium: Left atrial size was  normal in size. Right Atrium: Right atrial size was normal in size. Pericardium: There is no evidence of pericardial effusion. Mitral Valve: The mitral valve is normal in structure. No evidence of mitral valve regurgitation. Tricuspid Valve: The tricuspid valve is normal in structure. Tricuspid valve regurgitation is not demonstrated. Aortic Valve: The aortic valve was not well visualized. Aortic valve regurgitation is mild to moderate. Aortic regurgitation PHT measures 609 msec. Aortic valve sclerosis/calcification is present, without any evidence of aortic stenosis. Aortic valve mean gradient measures 3.0 mmHg. Aortic valve peak gradient measures 4.9 mmHg. Aortic valve area, by VTI measures 3.08 cm. Pulmonic Valve: The pulmonic valve was normal in structure. Pulmonic valve regurgitation is not visualized. Aorta: The aortic root is normal in size and structure. Venous: The inferior vena cava was not well visualized. IAS/Shunts: No atrial level shunt detected by color flow Doppler.  LEFT VENTRICLE PLAX 2D LVIDd:         4.10 cm   Diastology LVIDs:         2.80 cm   LV e' medial:    5.22 cm/s LV PW:         1.10 cm   LV E/e' medial:  7.0 LV IVS:        1.20 cm   LV e' lateral:   6.64 cm/s LVOT diam:     2.00 cm   LV E/e' lateral: 5.5 LV SV:         58 LV SV Index:   33 LVOT Area:     3.14 cm  RIGHT VENTRICLE RV Basal diam:  2.30 cm RV S prime:     8.16 cm/s LEFT ATRIUM             Index        RIGHT ATRIUM          Index LA diam:        3.40 cm 1.90 cm/m   RA Area:     7.69 cm LA Vol (A2C):   44.9 ml 25.07 ml/m  RA Volume:   12.60 ml 7.04 ml/m LA Vol (A4C):   44.7 ml 24.96 ml/m LA Biplane Vol: 45.7 ml 25.52 ml/m  AORTIC VALVE                    PULMONIC  VALVE AV Area (Vmax):    2.78 cm     PV Vmax:       0.72 m/s AV Area (Vmean):   2.73 cm     PV Vmean:      52.000 cm/s AV Area (VTI):     3.08 cm     PV VTI:        0.145 m AV Vmax:           111.00 cm/s  PV Peak grad:  2.1 mmHg AV Vmean:          73.600 cm/s  PV Mean grad:  1.0 mmHg AV VTI:            0.190 m AV Peak Grad:      4.9 mmHg AV Mean Grad:      3.0 mmHg LVOT Vmax:         98.20 cm/s LVOT Vmean:        63.900 cm/s LVOT VTI:          0.186 m LVOT/AV VTI ratio: 0.98 AI PHT:            609 msec  AORTA  Ao Root diam: 2.90 cm MITRAL VALVE MV Area (PHT): 4.65 cm    SHUNTS MV Decel Time: 163 msec    Systemic VTI:  0.19 m MV E velocity: 36.70 cm/s  Systemic Diam: 2.00 cm MV A velocity: 78.60 cm/s MV E/A ratio:  0.47 Debbe Odea MD Electronically signed by Debbe Odea MD Signature Date/Time: 11/02/2022/12:43:30 PM    Final    MR BRAIN WO CONTRAST  Result Date: 10/31/2022 CLINICAL DATA:  Initial evaluation for neuro deficit, stroke suspected. Left facial droop and sensory deficit. EXAM: MRI HEAD WITHOUT CONTRAST MRA HEAD WITHOUT CONTRAST MRA NECK WITHOUT AND WITH CONTRAST TECHNIQUE: Multiplanar, multi-echo pulse sequences of the brain and surrounding structures were acquired without intravenous contrast. Angiographic images of the Circle of Willis were acquired using MRA technique without intravenous contrast. Angiographic images of the neck were acquired using MRA technique without and with intravenous contrast. Carotid stenosis measurements (when applicable) are obtained utilizing NASCET criteria, using the distal internal carotid diameter as the denominator. CONTRAST:  45mL GADAVIST GADOBUTROL 1 MMOL/ML IV SOLN COMPARISON:  Prior CT from earlier the same day. FINDINGS: MRI HEAD FINDINGS Brain: Cerebral volume within normal limits for age. Scattered patchy T2/FLAIR hyperintensity involving the periventricular deep white matter both cerebral hemispheres, most consistent with chronic small vessel  ischemic disease. Encephalomalacia and gliosis involving the left temporal occipital region, consistent with a chronic left PCA distribution infarct. Small focus of subtle diffusion signal abnormality seen involving the subcortical right frontal lobe (series 9, image 28). Suggestion of associated signal loss on corresponding ADC map (series 10, image 28). Finding consistent with a subtle acute right MCA territory infarct. No associated hemorrhage or mass effect. No other evidence for acute or subacute ischemia. Gray-white matter differentiation otherwise maintained. No acute or chronic intracranial blood products. No mass lesion, midline shift or mass effect. No hydrocephalus or extra-axial fluid collection. Pituitary gland and suprasellar region within normal limits. Vascular: Major intracranial vascular flow voids are maintained. Skull and upper cervical spine: Craniocervical junction within normal limits. Bone marrow signal intensity normal. No scalp soft tissue abnormality. Sinuses/Orbits: Prior bilateral ocular lens replacement. Mild scattered mucosal thickening noted about the ethmoidal air cells. Paranasal sinuses are otherwise clear. No mastoid effusion. Other: None. MRA HEAD FINDINGS Anterior circulation: Visualized distal cervical segments of the internal carotid arteries are patent with antegrade flow. Petrous segments patent bilaterally. Mild atheromatous irregularity within the carotid siphons without hemodynamically significant stenosis. 4 mm outpouching extending posteriorly from the supraclinoid left ICA is seen (series 1, image 148), indeterminate, but favored to reflect a small vascular infundibulum related to aid occluded hypoplastic left P1 segment. A1 segments, anterior 3 cardiac complex common anterior cerebral arteries patent without significant stenosis. No M1 stenosis or occlusion. No proximal MCA branch occlusion or high-grade stenosis. Distal MCA branches perfused and symmetric. Posterior  circulation: Left vertebral artery dominant and widely patent to the vertebrobasilar junction. Left PICA not seen. Right vertebral artery largely terminates in PICA, although a small branch is seen ascending towards the vertebrobasilar junction. Right PICA patent. Basilar patent without stenosis. Superior cerebral arteries patent bilaterally. Right PCA is primarily supplied via the basilar and is widely patent to its distal aspect. Chronic occlusion of the left PCA noted, in keeping with the chronic left PCA territory infarct. Anatomic variants: As above. MRA NECK FINDINGS Aortic arch: Visualized aortic arch normal caliber with standard 3 vessel morphology. No stenosis seen about the origin the great vessels. Right carotid system: Right common and internal  carotid arteries patent without evidence for dissection. Mild atheromatous irregularity about the right carotid bulb, but no hemodynamically significant greater than 50% stenosis. Left carotid system: Left common and internal carotid arteries patent without evidence for dissection. Mild atheromatous irregularity about the left carotid bulb, but no hemodynamically significant greater than 50% stenosis. Vertebral arteries: Both vertebral arteries arise from subclavian arteries. No proximal subclavian artery stenosis. Left vertebral artery dominant. Vertebral arteries patent within the neck without stenosis or evidence for dissection. Other: None IMPRESSION: MRI HEAD IMPRESSION: 1. Subtle small acute ischemic nonhemorrhagic right MCA territory infarct involving the subcortical right frontal lobe. 2. Chronic left PCA distribution infarct. 3. Underlying mild chronic microvascular ischemic disease. MRA HEAD IMPRESSION: 1. Negative intracranial MRA for acute large vessel occlusion. 2. Chronic left PCA occlusion, in keeping with the chronic left PCA territory infarct. 3. 4 mm outpouching extending posteriorly from the supraclinoid left ICA, indeterminate, but favored to  reflect a small vascular infundibulum related to the occluded hypoplastic left P1 segment. Although felt to be less likely, a possible small aneurysm is difficult to exclude. Attention at follow-up recommended. MRA NECK IMPRESSION: 1. Negative MRA of the neck for acute vascular abnormality. 2. Mild atheromatous irregularity about the carotid bifurcations, but no hemodynamically significant greater than 50% stenosis. 3. Wide patency of both vertebral arteries within the neck. Left vertebral artery dominant. Electronically Signed   By: Jeannine Boga M.D.   On: 10/31/2022 23:58   MR ANGIO HEAD WO CONTRAST  Result Date: 10/31/2022 CLINICAL DATA:  Initial evaluation for neuro deficit, stroke suspected. Left facial droop and sensory deficit. EXAM: MRI HEAD WITHOUT CONTRAST MRA HEAD WITHOUT CONTRAST MRA NECK WITHOUT AND WITH CONTRAST TECHNIQUE: Multiplanar, multi-echo pulse sequences of the brain and surrounding structures were acquired without intravenous contrast. Angiographic images of the Circle of Willis were acquired using MRA technique without intravenous contrast. Angiographic images of the neck were acquired using MRA technique without and with intravenous contrast. Carotid stenosis measurements (when applicable) are obtained utilizing NASCET criteria, using the distal internal carotid diameter as the denominator. CONTRAST:  89mL GADAVIST GADOBUTROL 1 MMOL/ML IV SOLN COMPARISON:  Prior CT from earlier the same day. FINDINGS: MRI HEAD FINDINGS Brain: Cerebral volume within normal limits for age. Scattered patchy T2/FLAIR hyperintensity involving the periventricular deep white matter both cerebral hemispheres, most consistent with chronic small vessel ischemic disease. Encephalomalacia and gliosis involving the left temporal occipital region, consistent with a chronic left PCA distribution infarct. Small focus of subtle diffusion signal abnormality seen involving the subcortical right frontal lobe (series  9, image 28). Suggestion of associated signal loss on corresponding ADC map (series 10, image 28). Finding consistent with a subtle acute right MCA territory infarct. No associated hemorrhage or mass effect. No other evidence for acute or subacute ischemia. Gray-white matter differentiation otherwise maintained. No acute or chronic intracranial blood products. No mass lesion, midline shift or mass effect. No hydrocephalus or extra-axial fluid collection. Pituitary gland and suprasellar region within normal limits. Vascular: Major intracranial vascular flow voids are maintained. Skull and upper cervical spine: Craniocervical junction within normal limits. Bone marrow signal intensity normal. No scalp soft tissue abnormality. Sinuses/Orbits: Prior bilateral ocular lens replacement. Mild scattered mucosal thickening noted about the ethmoidal air cells. Paranasal sinuses are otherwise clear. No mastoid effusion. Other: None. MRA HEAD FINDINGS Anterior circulation: Visualized distal cervical segments of the internal carotid arteries are patent with antegrade flow. Petrous segments patent bilaterally. Mild atheromatous irregularity within the carotid siphons without hemodynamically significant  stenosis. 4 mm outpouching extending posteriorly from the supraclinoid left ICA is seen (series 1, image 148), indeterminate, but favored to reflect a small vascular infundibulum related to aid occluded hypoplastic left P1 segment. A1 segments, anterior 3 cardiac complex common anterior cerebral arteries patent without significant stenosis. No M1 stenosis or occlusion. No proximal MCA branch occlusion or high-grade stenosis. Distal MCA branches perfused and symmetric. Posterior circulation: Left vertebral artery dominant and widely patent to the vertebrobasilar junction. Left PICA not seen. Right vertebral artery largely terminates in PICA, although a small branch is seen ascending towards the vertebrobasilar junction. Right PICA  patent. Basilar patent without stenosis. Superior cerebral arteries patent bilaterally. Right PCA is primarily supplied via the basilar and is widely patent to its distal aspect. Chronic occlusion of the left PCA noted, in keeping with the chronic left PCA territory infarct. Anatomic variants: As above. MRA NECK FINDINGS Aortic arch: Visualized aortic arch normal caliber with standard 3 vessel morphology. No stenosis seen about the origin the great vessels. Right carotid system: Right common and internal carotid arteries patent without evidence for dissection. Mild atheromatous irregularity about the right carotid bulb, but no hemodynamically significant greater than 50% stenosis. Left carotid system: Left common and internal carotid arteries patent without evidence for dissection. Mild atheromatous irregularity about the left carotid bulb, but no hemodynamically significant greater than 50% stenosis. Vertebral arteries: Both vertebral arteries arise from subclavian arteries. No proximal subclavian artery stenosis. Left vertebral artery dominant. Vertebral arteries patent within the neck without stenosis or evidence for dissection. Other: None IMPRESSION: MRI HEAD IMPRESSION: 1. Subtle small acute ischemic nonhemorrhagic right MCA territory infarct involving the subcortical right frontal lobe. 2. Chronic left PCA distribution infarct. 3. Underlying mild chronic microvascular ischemic disease. MRA HEAD IMPRESSION: 1. Negative intracranial MRA for acute large vessel occlusion. 2. Chronic left PCA occlusion, in keeping with the chronic left PCA territory infarct. 3. 4 mm outpouching extending posteriorly from the supraclinoid left ICA, indeterminate, but favored to reflect a small vascular infundibulum related to the occluded hypoplastic left P1 segment. Although felt to be less likely, a possible small aneurysm is difficult to exclude. Attention at follow-up recommended. MRA NECK IMPRESSION: 1. Negative MRA of the  neck for acute vascular abnormality. 2. Mild atheromatous irregularity about the carotid bifurcations, but no hemodynamically significant greater than 50% stenosis. 3. Wide patency of both vertebral arteries within the neck. Left vertebral artery dominant. Electronically Signed   By: Jeannine Boga M.D.   On: 10/31/2022 23:58   MR ANGIO NECK W WO CONTRAST  Result Date: 10/31/2022 CLINICAL DATA:  Initial evaluation for neuro deficit, stroke suspected. Left facial droop and sensory deficit. EXAM: MRI HEAD WITHOUT CONTRAST MRA HEAD WITHOUT CONTRAST MRA NECK WITHOUT AND WITH CONTRAST TECHNIQUE: Multiplanar, multi-echo pulse sequences of the brain and surrounding structures were acquired without intravenous contrast. Angiographic images of the Circle of Willis were acquired using MRA technique without intravenous contrast. Angiographic images of the neck were acquired using MRA technique without and with intravenous contrast. Carotid stenosis measurements (when applicable) are obtained utilizing NASCET criteria, using the distal internal carotid diameter as the denominator. CONTRAST:  49mL GADAVIST GADOBUTROL 1 MMOL/ML IV SOLN COMPARISON:  Prior CT from earlier the same day. FINDINGS: MRI HEAD FINDINGS Brain: Cerebral volume within normal limits for age. Scattered patchy T2/FLAIR hyperintensity involving the periventricular deep white matter both cerebral hemispheres, most consistent with chronic small vessel ischemic disease. Encephalomalacia and gliosis involving the left temporal occipital region, consistent with  a chronic left PCA distribution infarct. Small focus of subtle diffusion signal abnormality seen involving the subcortical right frontal lobe (series 9, image 28). Suggestion of associated signal loss on corresponding ADC map (series 10, image 28). Finding consistent with a subtle acute right MCA territory infarct. No associated hemorrhage or mass effect. No other evidence for acute or subacute  ischemia. Gray-white matter differentiation otherwise maintained. No acute or chronic intracranial blood products. No mass lesion, midline shift or mass effect. No hydrocephalus or extra-axial fluid collection. Pituitary gland and suprasellar region within normal limits. Vascular: Major intracranial vascular flow voids are maintained. Skull and upper cervical spine: Craniocervical junction within normal limits. Bone marrow signal intensity normal. No scalp soft tissue abnormality. Sinuses/Orbits: Prior bilateral ocular lens replacement. Mild scattered mucosal thickening noted about the ethmoidal air cells. Paranasal sinuses are otherwise clear. No mastoid effusion. Other: None. MRA HEAD FINDINGS Anterior circulation: Visualized distal cervical segments of the internal carotid arteries are patent with antegrade flow. Petrous segments patent bilaterally. Mild atheromatous irregularity within the carotid siphons without hemodynamically significant stenosis. 4 mm outpouching extending posteriorly from the supraclinoid left ICA is seen (series 1, image 148), indeterminate, but favored to reflect a small vascular infundibulum related to aid occluded hypoplastic left P1 segment. A1 segments, anterior 3 cardiac complex common anterior cerebral arteries patent without significant stenosis. No M1 stenosis or occlusion. No proximal MCA branch occlusion or high-grade stenosis. Distal MCA branches perfused and symmetric. Posterior circulation: Left vertebral artery dominant and widely patent to the vertebrobasilar junction. Left PICA not seen. Right vertebral artery largely terminates in PICA, although a small branch is seen ascending towards the vertebrobasilar junction. Right PICA patent. Basilar patent without stenosis. Superior cerebral arteries patent bilaterally. Right PCA is primarily supplied via the basilar and is widely patent to its distal aspect. Chronic occlusion of the left PCA noted, in keeping with the chronic  left PCA territory infarct. Anatomic variants: As above. MRA NECK FINDINGS Aortic arch: Visualized aortic arch normal caliber with standard 3 vessel morphology. No stenosis seen about the origin the great vessels. Right carotid system: Right common and internal carotid arteries patent without evidence for dissection. Mild atheromatous irregularity about the right carotid bulb, but no hemodynamically significant greater than 50% stenosis. Left carotid system: Left common and internal carotid arteries patent without evidence for dissection. Mild atheromatous irregularity about the left carotid bulb, but no hemodynamically significant greater than 50% stenosis. Vertebral arteries: Both vertebral arteries arise from subclavian arteries. No proximal subclavian artery stenosis. Left vertebral artery dominant. Vertebral arteries patent within the neck without stenosis or evidence for dissection. Other: None IMPRESSION: MRI HEAD IMPRESSION: 1. Subtle small acute ischemic nonhemorrhagic right MCA territory infarct involving the subcortical right frontal lobe. 2. Chronic left PCA distribution infarct. 3. Underlying mild chronic microvascular ischemic disease. MRA HEAD IMPRESSION: 1. Negative intracranial MRA for acute large vessel occlusion. 2. Chronic left PCA occlusion, in keeping with the chronic left PCA territory infarct. 3. 4 mm outpouching extending posteriorly from the supraclinoid left ICA, indeterminate, but favored to reflect a small vascular infundibulum related to the occluded hypoplastic left P1 segment. Although felt to be less likely, a possible small aneurysm is difficult to exclude. Attention at follow-up recommended. MRA NECK IMPRESSION: 1. Negative MRA of the neck for acute vascular abnormality. 2. Mild atheromatous irregularity about the carotid bifurcations, but no hemodynamically significant greater than 50% stenosis. 3. Wide patency of both vertebral arteries within the neck. Left vertebral artery  dominant. Electronically  Signed   By: Jeannine Boga M.D.   On: 10/31/2022 23:58   CT HEAD CODE STROKE WO CONTRAST  Result Date: 10/31/2022 CLINICAL DATA:  Code stroke.  Neuro deficit, acute, stroke suspected EXAM: CT HEAD WITHOUT CONTRAST TECHNIQUE: Contiguous axial images were obtained from the base of the skull through the vertex without intravenous contrast. RADIATION DOSE REDUCTION: This exam was performed according to the departmental dose-optimization program which includes automated exposure control, adjustment of the mA and/or kV according to patient size and/or use of iterative reconstruction technique. COMPARISON:  CT head September 01, 2021. FINDINGS: Brain: Remote left PCA territory infarct. No evidence of acute large vascular territory infarct, mass lesion, midline shift or hydrocephalus. Vascular: No hyperdense vessel. Skull: No acute fracture. Sinuses/Orbits: Clear sinuses.  No acute orbital findings. ASPECTS [facility] Stroke Program Early CT Score) total score (0-10 with 10 being normal): 10. IMPRESSION: 1. No evidence of acute intracranial abnormality.  ASPECTS is 10. 2. Remote left PCA territory infarct. Code stroke imaging results were communicated on 10/31/2022 at 7:30 pm to provider Archie Balboa via telephone, who verbally acknowledged these results. Electronically Signed   By: Margaretha Sheffield M.D.   On: 10/31/2022 19:32    Microbiology: Results for orders placed or performed during the hospital encounter of 11/08/15  Surgical pcr screen     Status: None   Collection Time: 11/08/15  2:45 PM   Specimen: Nasal Mucosa; Nasal Swab  Result Value Ref Range Status   MRSA, PCR NEGATIVE NEGATIVE Final   Staphylococcus aureus NEGATIVE NEGATIVE Final    Comment:        The Xpert SA Assay (FDA approved for NASAL specimens in patients over 25 years of age), is one component of a comprehensive surveillance program.  Test performance has been validated by [facility] for patients  greater than or equal to 63 year old. It is not intended to diagnose infection nor to guide or monitor treatment.     Labs: CBC: Recent Labs  Lab 10/31/22 1920  WBC 7.0  NEUTROABS 4.6  HGB 13.5  HCT 42.3  MCV 98.6  PLT 0000000   Basic Metabolic Panel: Recent Labs  Lab 10/31/22 1920  NA 141  K 4.3  CL 106  CO2 27  GLUCOSE 135*  BUN 15  CREATININE 0.92  CALCIUM 8.9   Liver Function Tests: Recent Labs  Lab 10/31/22 1920  AST 28  ALT 11  ALKPHOS 55  BILITOT 1.0  PROT 6.9  ALBUMIN 4.0   CBG: Recent Labs  Lab 11/01/22 1618 11/01/22 2144 11/02/22 0028 11/02/22 0803 11/02/22 1227  GLUCAP 78 117* 108* 121* 104*    Discharge time spent: less than 30 minutes.  Signed: Annita Brod, MD [facility] 11/02/2022

## 2022-11-02 NOTE — TOC Initial Note (Addendum)
Transition of Care [facility]) - Initial/Assessment Note    Patient Details  Name: Rhonda Park MRN: [id number] Date of Birth: [birth year]  Transition of [facility]) CM/SW Contact:    Carmina Miller, LCSWA Phone Number: 11/02/2022, 1:27 PM  Clinical Narrative:                 UPDATE-CSW received phone call back from pt's son Cornelius Moras, he is agreeable to pt receiving services at home. CSW spoke with Barbara Cower at [facility], they can provide services-PT/OT/Speech but will not be able to see pt until Tuesday and Speech will come before PT.    CSW attempted to reach both of pt's son's to discuss recommendation of HH PT, neither available-vm left with son Alese Oats. TOC will continue to follow.         Patient Goals and CMS Choice            Expected Discharge Plan and Services                                              Prior Living Arrangements/Services                       Activities of Daily Living Home Assistive Devices/Equipment: Dan Humphreys (specify type) ADL Screening (condition at time of admission) Patient's cognitive ability adequate to safely complete daily activities?: Yes Is the patient deaf or have difficulty hearing?: No Does the patient have difficulty seeing, even when wearing glasses/contacts?: No Does the patient have difficulty concentrating, remembering, or making decisions?: No Patient able to express need for assistance with ADLs?: No Does the patient have difficulty dressing or bathing?: No Independently performs ADLs?: Yes (appropriate for developmental age) Does the patient have difficulty walking or climbing stairs?: Yes Weakness of Legs: Left Weakness of Arms/Hands: None  Permission Sought/Granted                  Emotional Assessment              Admission diagnosis:  CVA (cerebral vascular accident) (HCC) [I63.9] Acute CVA (cerebrovascular accident) (HCC) [I63.9] Cerebrovascular accident (CVA), unspecified mechanism  (HCC) [I63.9] Patient Active Problem List   Diagnosis Date Noted   Overweight (BMI 25.0-29.9) 11/01/2022   Tobacco abuse 11/01/2022   CVA (cerebral vascular accident) (HCC) 11/01/2022   Acute CVA (cerebrovascular accident) (HCC) 10/31/2022   History of hemorrhagic cerebrovascular accident (CVA) with residual deficit 2017 10/31/2022   Bilateral carotid artery stenosis 10/31/2022   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 10/31/2022   Chronic obstructive lung disease (HCC) 10/31/2022   Aneurysm of thoracic aorta (HCC) 09/03/2022   Atherosclerosis of native arteries of extremity with intermittent claudication (HCC) 09/03/2022   Carotid stenosis 09/05/2021   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   S/P lumbar laminectomy 12/01/2015   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   Displacement of lumbar intervertebral disc without myelopathy 10/25/2015   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   Diabetes (HCC) 07/03/2014   Difficulty in walking 07/03/2014   Falls 07/03/2014   History of stroke 07/03/2014   Memory loss, short term 07/03/2014   Visual disturbance as complication of stroke 07/03/2014   Spinal stenosis in cervical region 05/23/2014   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 [place]) [place] [address] Phone: [phone] Fax: [phone]  [id number] - [place], [place] - [address] AT [facility] [address] [place] [address] [place] [address] Phone: [phone] Fax:  [phone]     Social Determinants of Health (SDOH) Social History: SDOH Screenings   Tobacco Use: High Risk (10/31/2022)   SDOH Interventions:     Readmission Risk Interventions     No data to display

## 2022-11-02 NOTE — Plan of Care (Signed)
  Problem: Ischemic Stroke/TIA Tissue Perfusion: Goal: Complications of ischemic stroke/TIA will be minimized Outcome: Progressing Note: Patient profile completed. Patient has visual loss from a previous stroke. No complaints of pain. Skin intact. Will continue to monitor and assess.

## 2022-11-02 NOTE — Progress Notes (Signed)
*  PRELIMINARY RESULTS* Echocardiogram 2D Echocardiogram has been performed.  Rhonda Park 11/02/2022, 8:59 AM

## 2022-11-02 NOTE — Progress Notes (Signed)
   11/02/22 0544  Assess: MEWS Score  Temp 97.9 F (36.6 C)  BP (!) 206/63  MAP (mmHg) 100  Pulse Rate (!) 59  Resp 19  SpO2 100 %  O2 Device Room Air  Assess: MEWS Score  MEWS Temp 0  MEWS Systolic 2  MEWS Pulse 0  MEWS RR 0  MEWS LOC 0  MEWS Score 2  MEWS Score Color Yellow  Assess: if the MEWS score is Yellow or Red  Were vital signs taken at a resting state? Yes  Focused Assessment No change from prior assessment  Does the patient meet 2 or more of the SIRS criteria? No  MEWS guidelines implemented *See Row Information* Yes  Treat  MEWS Interventions Administered prn meds/treatments  Pain Scale 0-10  Pain Score 0  Take Vital Signs  Increase Vital Sign Frequency  Yellow: Q 2hr X 2 then Q 4hr X 2, if remains yellow, continue Q 4hrs  Assess: SIRS CRITERIA  SIRS Temperature  0  SIRS Pulse 0  SIRS Respirations  0  SIRS WBC 0  SIRS Score Sum  0   Patient is admitted for acute stroke, patient has an elevated blood pressure that is being treated with PRNS.

## 2022-11-04 LAB — HEMOGLOBIN A1C
Hgb A1c MFr Bld: 5.4 % (ref 4.8–5.6)
Hgb A1c MFr Bld: 5.3 % (ref 4.8–5.6)
Mean Plasma Glucose: 108 mg/dL
Mean Plasma Glucose: 105 mg/dL

## 2023-06-02 ENCOUNTER — Ambulatory Visit: Payer: Medicare HMO | Admitting: Cardiovascular Disease

## 2023-07-12 DEATH — deceased

## 2023-08-20 ENCOUNTER — Ambulatory Visit: Payer: Self-pay | Admitting: Cardiovascular Disease
